# Patient Record
Sex: Female | Born: 1975 | Hispanic: Yes | Marital: Single | Smoking: Former smoker
Health system: Southern US, Community
[De-identification: ages and names within clinical notes are randomized; demographics above are authoritative.]

## PROBLEM LIST (undated history)

## (undated) DIAGNOSIS — Z8719 Personal history of other diseases of the digestive system: Secondary | ICD-10-CM

## (undated) DIAGNOSIS — Z8669 Personal history of other diseases of the nervous system and sense organs: Secondary | ICD-10-CM

## (undated) DIAGNOSIS — Z9289 Personal history of other medical treatment: Secondary | ICD-10-CM

## (undated) DIAGNOSIS — R519 Headache, unspecified: Secondary | ICD-10-CM

## (undated) DIAGNOSIS — R51 Headache: Secondary | ICD-10-CM

## (undated) DIAGNOSIS — Z8711 Personal history of peptic ulcer disease: Secondary | ICD-10-CM

## (undated) DIAGNOSIS — T7840XA Allergy, unspecified, initial encounter: Secondary | ICD-10-CM

## (undated) DIAGNOSIS — A770 Spotted fever due to Rickettsia rickettsii: Secondary | ICD-10-CM

## (undated) DIAGNOSIS — M7580 Other shoulder lesions, unspecified shoulder: Secondary | ICD-10-CM

## (undated) DIAGNOSIS — D649 Anemia, unspecified: Secondary | ICD-10-CM

## (undated) HISTORY — DX: Headache: R51

## (undated) HISTORY — DX: Headache, unspecified: R51.9

## (undated) HISTORY — DX: Personal history of other diseases of the digestive system: Z87.19

## (undated) HISTORY — DX: Anemia, unspecified: D64.9

## (undated) HISTORY — DX: Personal history of peptic ulcer disease: Z87.11

## (undated) HISTORY — DX: Spotted fever due to Rickettsia rickettsii: A77.0

## (undated) HISTORY — PX: CHOLECYSTECTOMY: SHX55

## (undated) HISTORY — DX: Personal history of other diseases of the nervous system and sense organs: Z86.69

## (undated) HISTORY — DX: Allergy, unspecified, initial encounter: T78.40XA

## (undated) HISTORY — DX: Other shoulder lesions, unspecified shoulder: M75.80

## (undated) HISTORY — PX: COSMETIC SURGERY: SHX468

## (undated) HISTORY — PX: OTHER SURGICAL HISTORY: SHX169

## (undated) HISTORY — DX: Personal history of other medical treatment: Z92.89

---

## 1998-06-12 ENCOUNTER — Encounter: Payer: Self-pay | Admitting: Emergency Medicine

## 1998-06-12 ENCOUNTER — Emergency Department (HOSPITAL_COMMUNITY): Admission: EM | Admit: 1998-06-12 | Discharge: 1998-06-12 | Payer: Self-pay | Admitting: Emergency Medicine

## 1998-08-07 DIAGNOSIS — R569 Unspecified convulsions: Secondary | ICD-10-CM

## 1998-08-07 HISTORY — DX: Unspecified convulsions: R56.9

## 2000-10-24 ENCOUNTER — Emergency Department (HOSPITAL_COMMUNITY): Admission: EM | Admit: 2000-10-24 | Discharge: 2000-10-24 | Payer: Self-pay | Admitting: Unknown Physician Specialty

## 2001-10-02 ENCOUNTER — Emergency Department (HOSPITAL_COMMUNITY): Admission: EM | Admit: 2001-10-02 | Discharge: 2001-10-02 | Payer: Self-pay

## 2002-01-17 ENCOUNTER — Encounter: Payer: Self-pay | Admitting: Internal Medicine

## 2002-01-17 ENCOUNTER — Encounter: Admission: RE | Admit: 2002-01-17 | Discharge: 2002-01-17 | Payer: Self-pay | Admitting: Internal Medicine

## 2002-01-17 ENCOUNTER — Encounter: Payer: Self-pay | Admitting: General Surgery

## 2002-01-17 ENCOUNTER — Inpatient Hospital Stay (HOSPITAL_COMMUNITY): Admission: EM | Admit: 2002-01-17 | Discharge: 2002-01-20 | Payer: Self-pay | Admitting: Endocrinology

## 2002-01-17 ENCOUNTER — Encounter (INDEPENDENT_AMBULATORY_CARE_PROVIDER_SITE_OTHER): Payer: Self-pay

## 2002-11-11 ENCOUNTER — Other Ambulatory Visit: Admission: RE | Admit: 2002-11-11 | Discharge: 2002-11-11 | Payer: Self-pay | Admitting: Obstetrics and Gynecology

## 2003-11-23 ENCOUNTER — Other Ambulatory Visit: Admission: RE | Admit: 2003-11-23 | Discharge: 2003-11-23 | Payer: Self-pay | Admitting: Obstetrics and Gynecology

## 2004-02-16 ENCOUNTER — Emergency Department (HOSPITAL_COMMUNITY): Admission: EM | Admit: 2004-02-16 | Discharge: 2004-02-17 | Payer: Self-pay | Admitting: Emergency Medicine

## 2004-03-13 ENCOUNTER — Ambulatory Visit (HOSPITAL_COMMUNITY): Admission: RE | Admit: 2004-03-13 | Discharge: 2004-03-13 | Payer: Self-pay | Admitting: Sports Medicine

## 2004-09-28 ENCOUNTER — Other Ambulatory Visit: Admission: RE | Admit: 2004-09-28 | Discharge: 2004-09-28 | Payer: Self-pay | Admitting: Obstetrics and Gynecology

## 2004-11-15 ENCOUNTER — Ambulatory Visit: Payer: Self-pay | Admitting: Internal Medicine

## 2006-03-02 IMAGING — CR DG KNEE COMPLETE 4+V*L*
4 series · 4 of 4 positions shown · non-contrast
Comparison: none

CLINICAL DATA: Left knee injury. 
 FOUR VIEW, LEFT KNEE ? 02/16/2004
 No prior studies.

[view not recorded (1 of 4)]
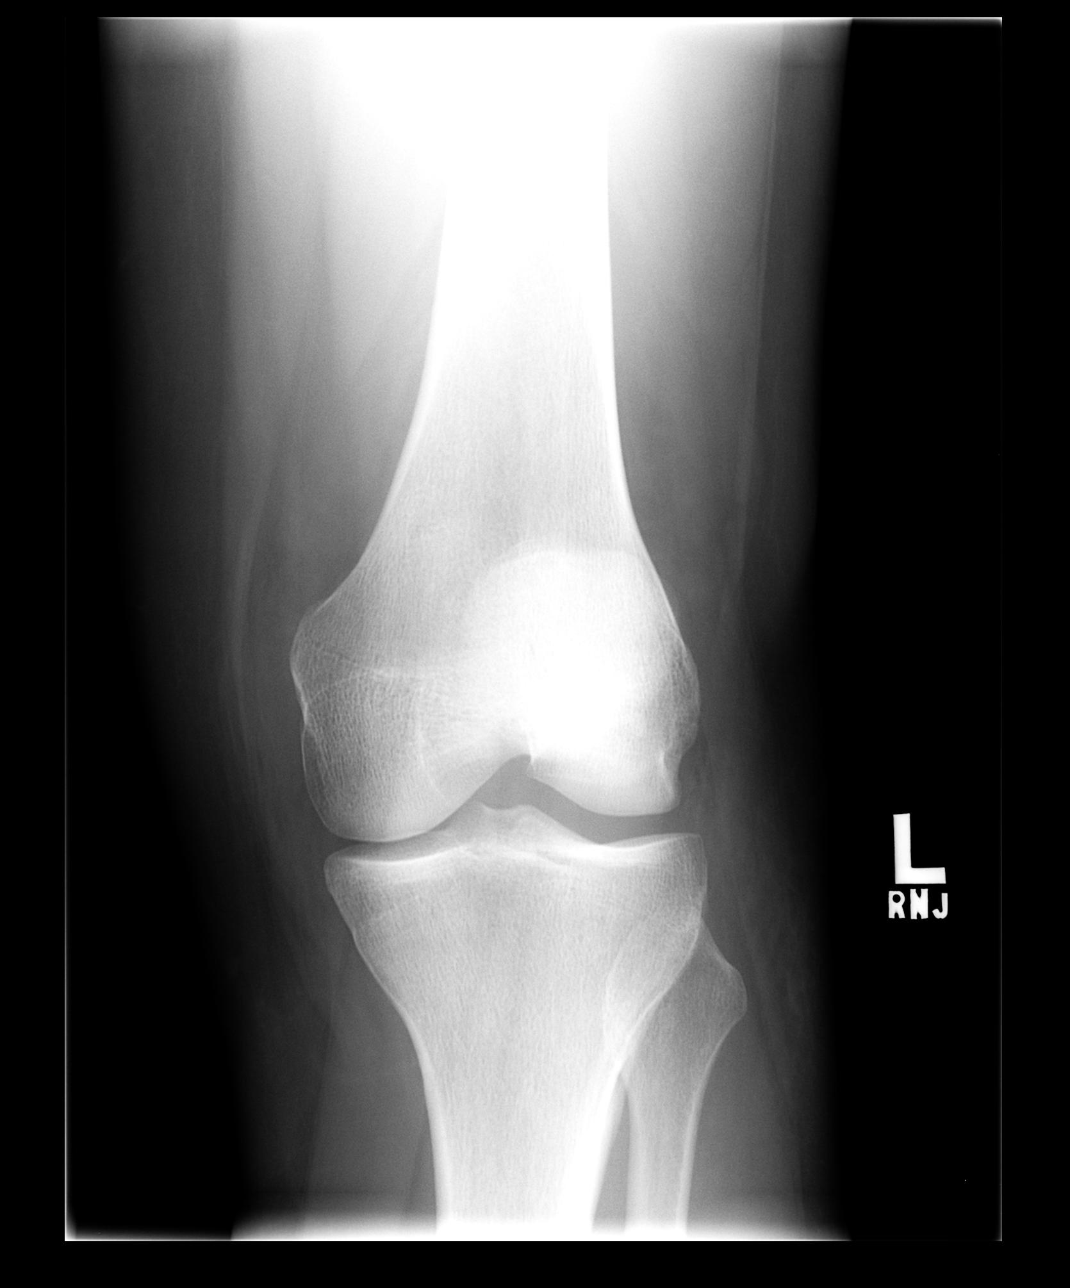

[view not recorded (2 of 4)]
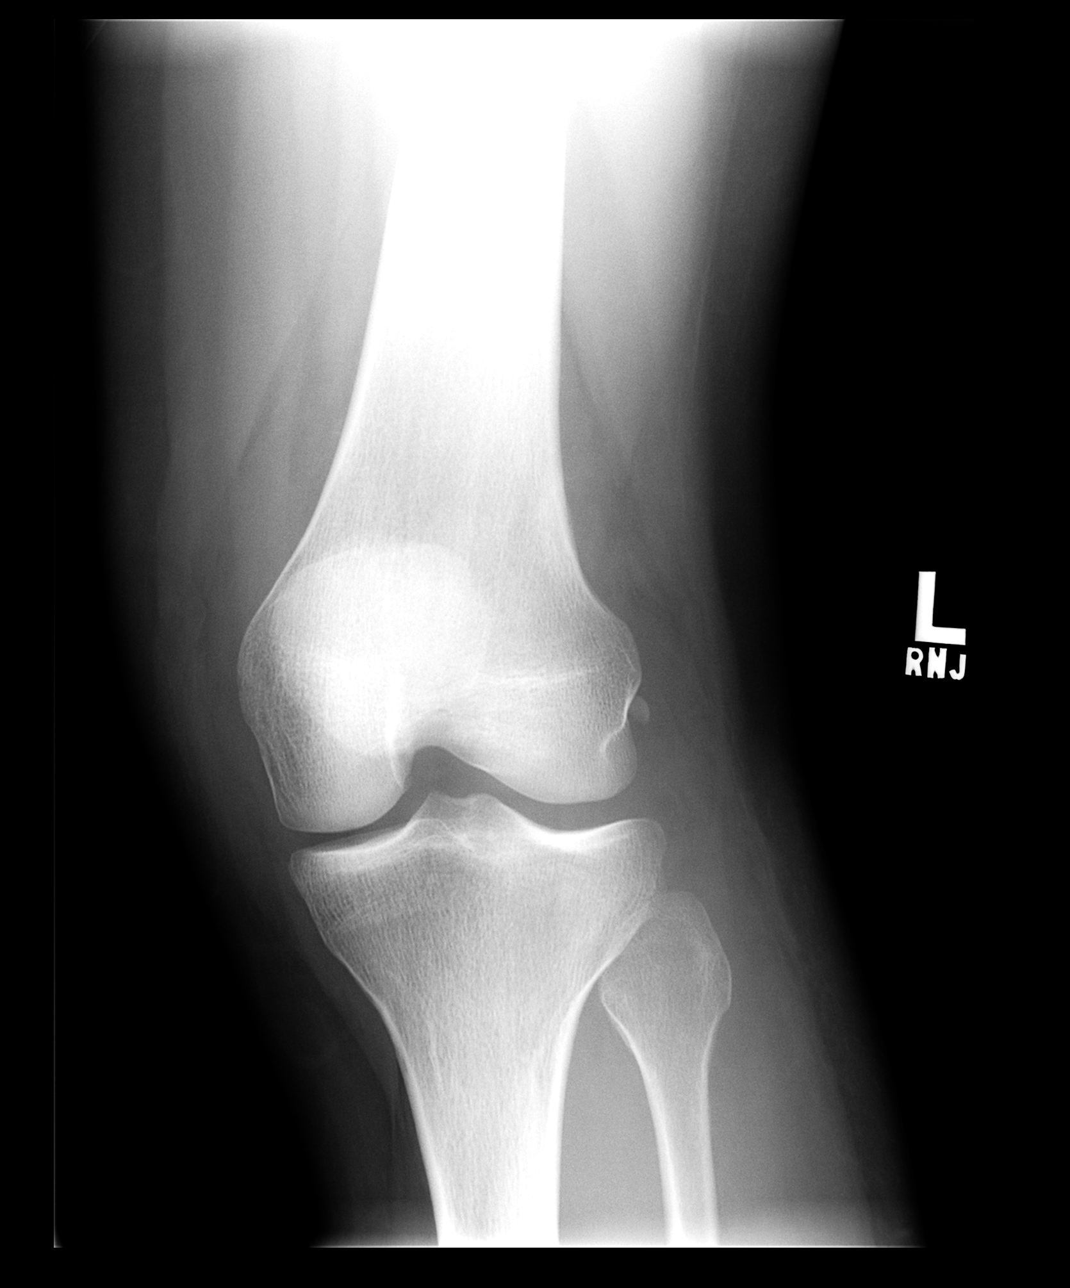

[view not recorded (3 of 4)]
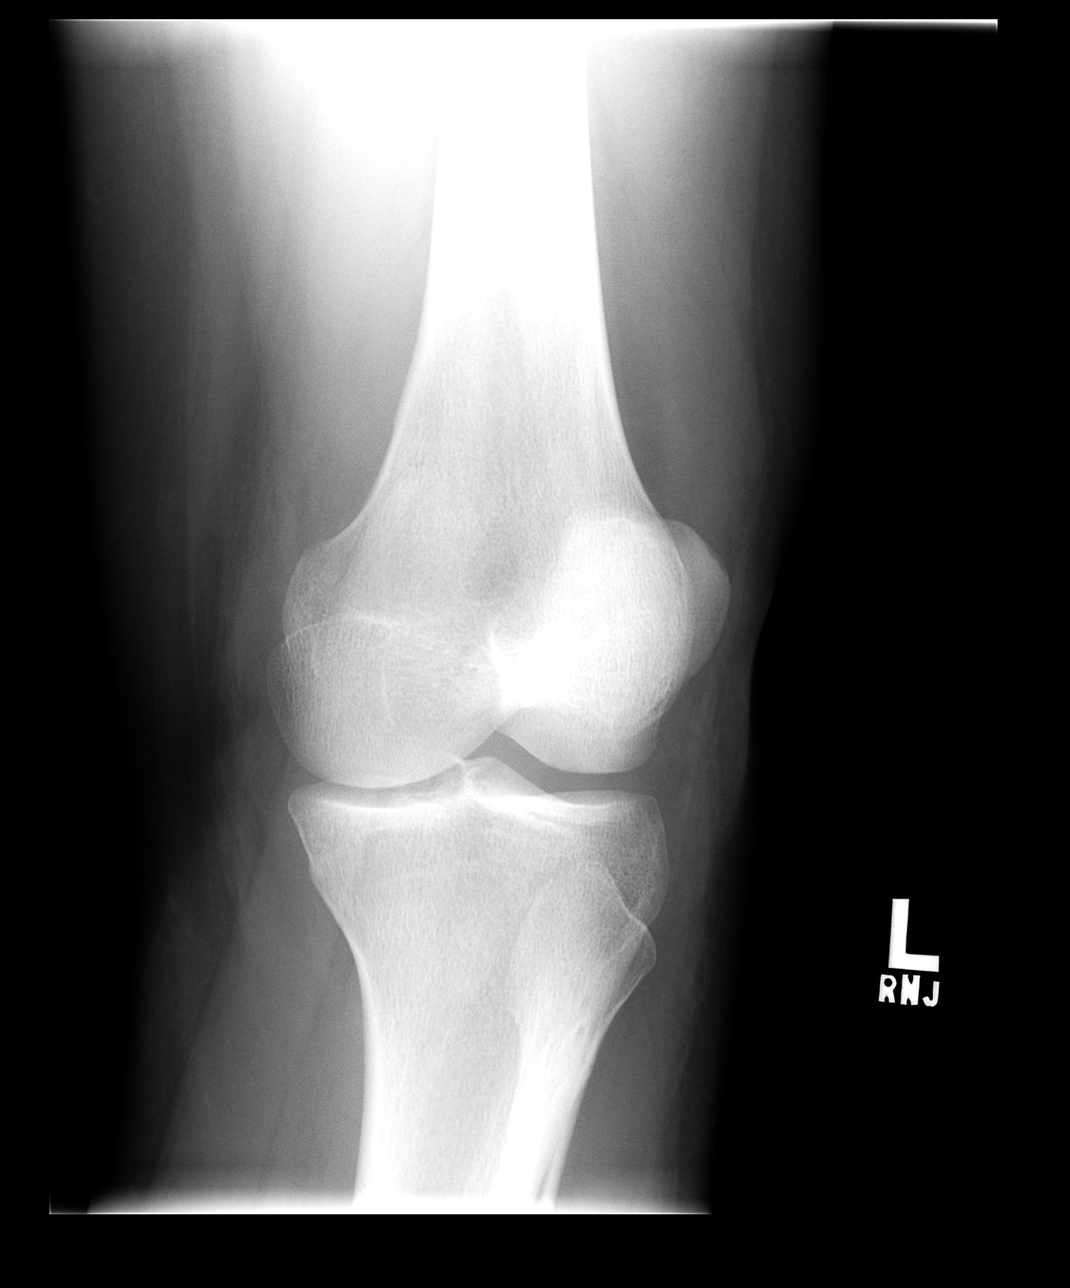

[view not recorded (4 of 4)]
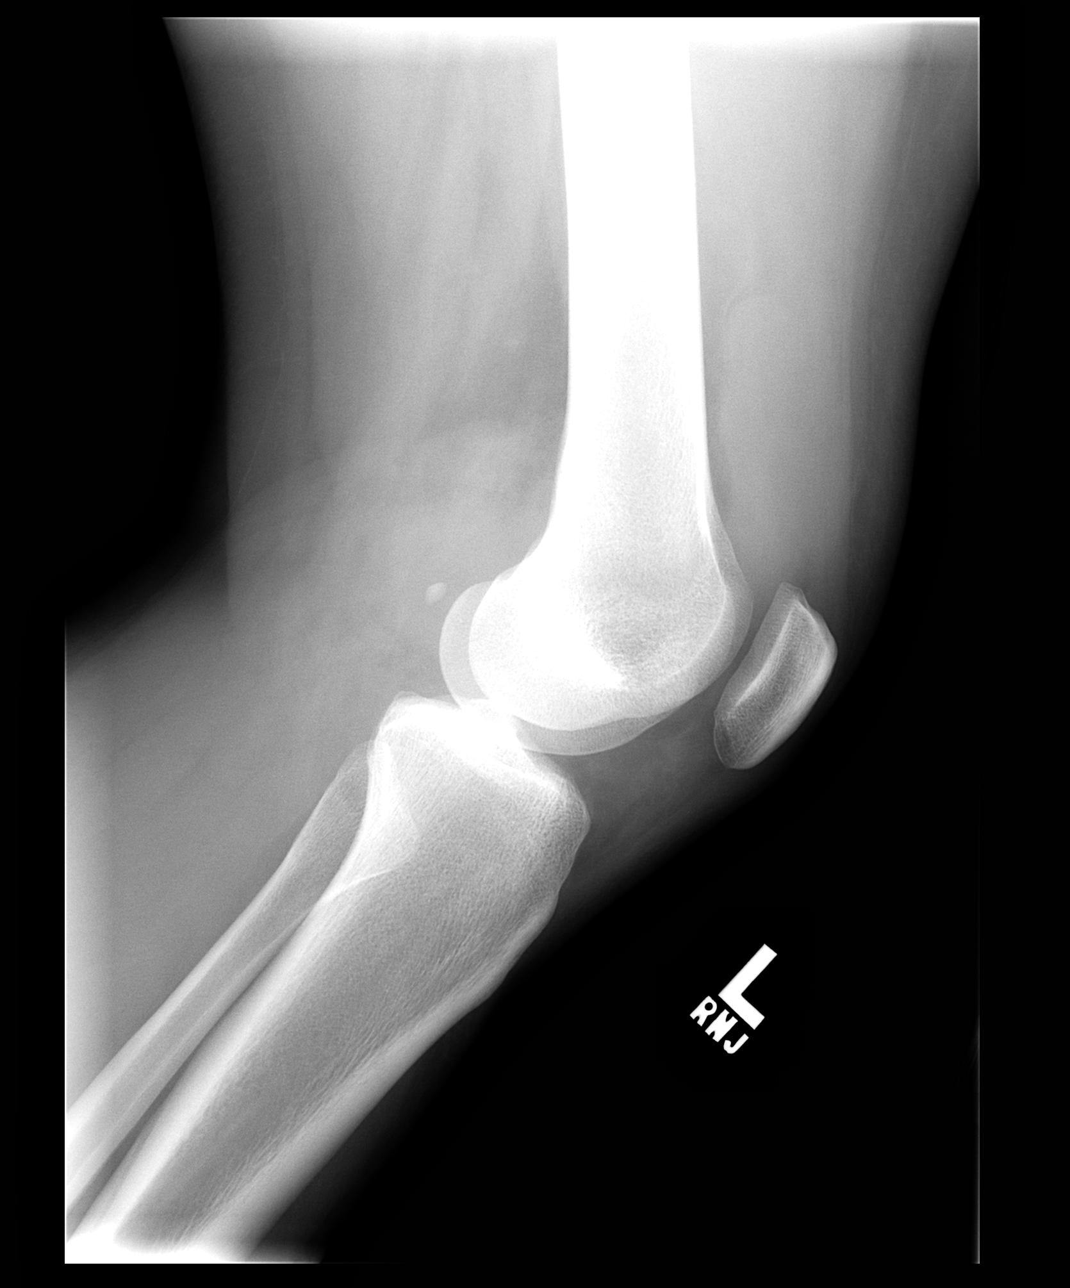

[4 of 4 positions shown; findings below may reference images not displayed]

FINDINGS: There is a prominent knee effusion.  No visible fracture. No dislocation is evident.  Note is made of a fabella. 
 IMPRESSION
 Knee effusion.  If there is a high degree of suspicion of internal derangement, then MRI may be helpful in further characterization.

## 2010-12-23 NOTE — H&P (Signed)
[facility]  Patient:    [name], [name] Visit Number: [id number] MRN: [id number]          Service Type: MED Location: [phone] Attending Physician:  Caleen Essex Dictated by:   Justine Null, M.D. [facility] Admit Date:  01/17/2002 Discharge Date: 01/20/2002                           History and Physical  DATE OF BIRTH:  [birth year].  REASON FOR ADMISSION:  Abdominal pain.  HISTORY OF PRESENT ILLNESS:  The patient is a 35 year old woman with four days of episodic severe right upper quadrant pain radiating across the right side of the abdomen.  There is associated shaking chills.  No particular contacts. There was an associated presyncopal episode three days ago but none since. Last menstrual period ended four days ago.  PAST MEDICAL HISTORY: 1. Epilepsy. 2. Migraine. 3. Insomnia. 4. Depression.  MEDICATIONS: 1. Zonegran 100 mg a day. 2. Ambien 10 mg daily. 3. Oral contraceptive.  REVIEW SOCIAL HISTORY:  Alcohol:  Occasional only.  The patient is a smoker. She is single.  She has no children.  She works as a Counsellor.  FAMILY HISTORY:  No one else at home is ill.  REVIEW OF SYSTEMS:  Denies the following:  Fever, headache, sore throat, earache, syncope, shortness of breath, diarrhea, heartburn, nausea, vomiting, bright red blood per rectum, hematuria, and dysuria.  PHYSICAL EXAMINATION:  VITAL SIGNS:  Blood pressure 120/80, heart rate is 80, temperature is 98.3, the weight is 143.  GENERAL:  No distress.  SKIN:  Not diaphoretic.  NODES:  There is no palpable lymphadenopathy.  HEENT:  Head is atraumatic.  Sclerae not icteric.  The pharynx is clear.  NECK:  Supple, no goiter.  CHEST:  Clear to auscultation.  It is nontender.  No respiratory distress.  CARDIAC:  No JVD, no edema.  Regular rate and rhythm, no murmur.  Pedal pulses are intact.  ABDOMEN:  Soft, nontender, no hepatosplenomegaly, no  mass.  Of particular note is that there is no tenderness to the right upper quadrant, even with deep palpation.  GYNECOLOGIC, RECTAL:  Not done at this time.  EXTREMITIES:  No obvious deformity of the joints.  NEUROLOGIC:  Alert, well-oriented.  Gait is observed to be normal, and cranial nerves are grossly intact.  LABORATORY DATA:  Urinalysis is normal.  CBC done three days ago is positive for elevated WBC at 14,700.  Ultrasound of the gallbladder today shows a small, contracted gallbladder with stones.  IMPRESSION: 1. Abdominal pain, uncertain etiology.  There is a high risk of serious    underlying infection given the seriousness of her clinical syndrome and her    elevated WBC and abnormal ultrasound. 2. Other chronic medical problems as noted above.  PLAN: 1. Admit to [facility] 2. Blood cultures. 3. Antibiotics.  Alternative antibiotics are chosen because she is allergic to    PENICILLIN. 4. Stat general surgery consultation.  I have called Ollen Gross. Vernell Morgans, M.D. Dictated by:   Justine Null, M.D. [facility] Attending Physician:  Caleen Essex DD:  01/17/02 TD:  01/20/02 Job: [id number]

## 2010-12-23 NOTE — Discharge Summary (Signed)
[facility]  Patient:    [name] [name], [name] [name] Visit Number: [id number] MRN: [id number]          Service Type: MED Location: [phone] Attending Physician:  Caleen Essex Dictated by:   Ollen Gross. Vernell Morgans, M.D. Admit Date:  01/17/2002 Discharge Date: 01/20/2002                             Discharge Summary  HISTORY OF PRESENT ILLNESS:  The patient is a 35 year old, African-American female who was admitted by the medical service with some right-sided abdominal pain as well as some subjective chills and shakes.  She has a history of a seizure disorder and was admitted to work this up.  She was also found to have gallstones.  HOSPITAL COURSE:  She was not symptomatic at the time of discharge, but since she was already admitted to the hospital and was in the process of being worked up, she elected to have her surgery during this hospitalization.  She was taken to the operating room on June 15, and underwent a laparoscopic cholecystectomy that was unremarkable.  By June 16, she was tolerating her diet and ready for discharge home.  DISCHARGE MEDICATIONS: 1. Resume home medications. 2. Prescription for Vicodin for pain.  DIET:  As tolerated.  ACTIVITY:  No heavy lifting.  CONDITION ON DISCHARGE:  Stable.  DISCHARGE DIAGNOSIS:  Cholelithiasis.  FOLLOWUP:  Follow up with the surgeons in two weeks.  DISPOSITION:  Discharged to home. Dictated by:   Ollen Gross. Vernell Morgans, M.D. Attending Physician:  Caleen Essex DD:  02/24/02 TD:  03/02/02 Job: [phone]

## 2010-12-23 NOTE — Op Note (Signed)
[facility]  Patient:    [name] [name], [name] [name] Visit Number: [id number] MRN: [id number]          Service Type: MED Location: [phone] Attending Physician:  Justine Null Dictated by:   Ollen Gross. Vernell Morgans, M.D. Proc. Date: 01/19/02 Admit Date:  01/17/2002                             Operative Report  PREOPERATIVE DIAGNOSIS:  Cholelithiasis.  POSTOPERATIVE DIAGNOSIS:  Cholelithiasis.  PROCEDURE:  Laparoscopic cholecystectomy.  SURGEON:  Ollen Gross. Vernell Morgans, M.D.  ASSISTANT:  Dr. Jamey Ripa.  ANESTHESIA:  General endotracheal.  DESCRIPTION OF PROCEDURE:  After informed consent was obtained, the patient was brought to the operating room and placed in the supine position on the operating table.  After adequate induction of general anesthesia, the patients abdomen was prepped with Betadine and draped in the usual sterile manner.  The area below the umbilicus was infiltrated with 0.25% Marcaine, and a small vertically-oriented incision was made with the 15 blade knife.  This incision was carried down through the subcutaneous tissue with blunt dissection using a Kelly clamp and [employer] retractors until the linea alba was identified.  The linea alba was incised with the 15 blade knife, and each side was grasped with Kocher clamps and elevated anteriorly.  The preperitoneal space was probed bluntly with a hemostat until the peritoneum was opened and access was gained to the abdominal cavity.  A 0 Vicryl pursestring stitch was placed in the fascia surrounding this opening.  A Hasson cannula was placed through this opening and anchored in place with the previously-placed Vicryl pursestring stitch.  The abdomen was insufflated with carbon dioxide without difficulty.  The patient was placed in the head-up position.  The laparoscope was placed through the Hasson cannula, and the edge of the liver and dome of the gallbladder were readily identifiable.  A  small transverse upper midline incision was made after infiltrating this area with 0.25% Marcaine, and a 10 mm port was then placed bluntly through this incision into the abdominal cavity under direct vision.  Sites were then chosen laterally on the right side of the abdomen for placement of 5 mm ports, and each of these areas were infiltrated with 0.25% Marcaine.  Small stab incisions were made with the 15 blade knife, and 5 mm ports were placed bluntly through these incisions into the abdominal cavity under direct vision. A blunt grasper was placed through the lateral-most 5 mm port and used to grasp the dome of the gallbladder and elevate it anteriorly and superiorly. Another blunt grasper was placed through the other 5 mm port and used to retract on the body and neck of the gallbladder.  A dissector was placed through the upper midline port, and the peritoneal reflection at the gallbladder neck was opened using the electrocautery.  Blunt dissection was then carried out in this area until the gallbladder neck cystic duct junction was readily identified, and a good window was created.  Once this was done, three clips were placed proximally, one distally on the cystic duct, and the duct was divided between the two.  Care was taken to keep the common duct medial through all of the dissection.  Posterior to this, the cystic artery was identified and again dissected in a circumferential manner until there was a good window.  Three clips were placed proximally, one distally on the artery, and the artery  was divided between the two.  Next, a laparoscopic hook cautery device was used to separate the gallbladder from the liver bed. Prior to completely detaching the gallbladder from the liver bed, the liver bed was inspected, and several small bleeding points were coagulated with electrocautery until the area was  hemostatic.  The gallbladder was then detached the rest of the way from the liver  bed without difficulty.  The laparoscope was then moved to the upper midline port, and a gallbladder grasper was placed through the Hasson cannula and grasped the neck of the gallbladder.  The gallbladder was removed with the Hasson cannula through the infraumbilical port without difficulty.  The fascia was then closed with the previously-placed Vicryl pursestring stitch.  The abdomen was then irrigated with saline until the effluent was clear.  The infraumbilical port was visualized from inside, and there appeared to be a small ooze from the fat which was coagulated with the electrocautery.  The rest of the ports were then removed under direct vision and were all hemostatic.  The skin incisions were all closed with interrupted 4-0 Monocryl subcuticular stitches.  Benzoin, Steri-Strips, and sterile dressings were applied.  The patient tolerated the procedure well.  At the end of case, all sponge, needle, and instrument counts were correct.  The patient was awakened and taken to the recovery room in stable condition. Dictated by:   Ollen Gross. Vernell Morgans, M.D. Attending Physician:  Justine Null DD:  01/19/02 TD:  01/20/02 Job: [phone]

## 2010-12-29 ENCOUNTER — Ambulatory Visit: Payer: Self-pay | Admitting: Internal Medicine

## 2010-12-29 DIAGNOSIS — Z0289 Encounter for other administrative examinations: Secondary | ICD-10-CM

## 2013-05-28 ENCOUNTER — Ambulatory Visit: Payer: Self-pay | Admitting: Internal Medicine

## 2013-05-30 ENCOUNTER — Other Ambulatory Visit (INDEPENDENT_AMBULATORY_CARE_PROVIDER_SITE_OTHER): Payer: 59

## 2013-05-30 ENCOUNTER — Ambulatory Visit (INDEPENDENT_AMBULATORY_CARE_PROVIDER_SITE_OTHER): Payer: 59 | Admitting: Internal Medicine

## 2013-05-30 ENCOUNTER — Encounter: Payer: Self-pay | Admitting: Internal Medicine

## 2013-05-30 VITALS — BP 130/92 | HR 89 | Temp 98.4°F | Ht 65.5 in | Wt 194.8 lb

## 2013-05-30 DIAGNOSIS — Z716 Tobacco abuse counseling: Secondary | ICD-10-CM

## 2013-05-30 DIAGNOSIS — Z130 Encounter for screening for diseases of the blood and blood-forming organs and certain disorders involving the immune mechanism: Secondary | ICD-10-CM

## 2013-05-30 DIAGNOSIS — Z13220 Encounter for screening for lipoid disorders: Secondary | ICD-10-CM

## 2013-05-30 DIAGNOSIS — Z0000 Encounter for general adult medical examination without abnormal findings: Secondary | ICD-10-CM

## 2013-05-30 DIAGNOSIS — Z1329 Encounter for screening for other suspected endocrine disorder: Secondary | ICD-10-CM

## 2013-05-30 DIAGNOSIS — Z23 Encounter for immunization: Secondary | ICD-10-CM

## 2013-05-30 DIAGNOSIS — Z131 Encounter for screening for diabetes mellitus: Secondary | ICD-10-CM

## 2013-05-30 LAB — CBC
HCT: 42.1 % (ref 36.0–46.0)
Hemoglobin: 13.8 g/dL (ref 12.0–15.0)
MCHC: 32.9 g/dL (ref 30.0–36.0)
MCV: 82.2 fl (ref 78.0–100.0)
Platelets: 261 10*3/uL (ref 150.0–400.0)
RDW: 13.9 % (ref 11.5–14.6)

## 2013-05-30 LAB — LIPID PANEL
HDL: 48.7 mg/dL (ref >39.00)
Total CHOL/HDL Ratio: 3
Triglycerides: 59 mg/dL (ref 0.0–149.0)
VLDL: 11.8 mg/dL (ref 0.0–40.0)

## 2013-05-30 LAB — COMPREHENSIVE METABOLIC PANEL
ALT: 20 U/L (ref 0–35)
AST: 17 U/L (ref 0–37)
BUN: 7 mg/dL (ref 6–23)
CO2: 29 mEq/L (ref 19–32)
Calcium: 9.4 mg/dL (ref 8.4–10.5)
Chloride: 105 mEq/L (ref 96–112)
Creatinine, Ser: 0.7 mg/dL (ref 0.4–1.2)
GFR: 96.43 mL/min (ref >60.00)
Total Bilirubin: 0.6 mg/dL (ref 0.3–1.2)

## 2013-05-30 LAB — TSH: TSH: 0.75 u[IU]/mL (ref 0.35–5.50)

## 2013-05-30 MED ORDER — VARENICLINE TARTRATE 0.5 MG X 11 & 1 MG X 42 PO MISC: ORAL | Status: DC

## 2013-05-30 NOTE — Addendum Note (Signed)
Addended by: Deatra James on: 05/30/2013 02:30 PM   Modules accepted: Orders

## 2013-05-30 NOTE — Progress Notes (Signed)
HPI  Pt presents to the clinic today to establish care. She is not transferring care from a PCP. She does have a gyn when she needs it. She does have some concerns today about smoking cessation. She would like to quit on 06/13/2013. She would like to [place] Chantix.  Flu: 05/2013 Tetanus: unsure of date-request one today LMP: 03/2013-hx of irreularity Pap Smear: 05/2013 Dentist: as needed   Past Medical History  Diagnosis Date  . Allergy   . Hx of migraines   . Headache   . History of stomach ulcers   . History of blood transfusion     Current Outpatient Prescriptions  Medication Sig Dispense Refill  . Prenatal Multivit-Min-Fe-FA (PRENATAL VITAMINS PO) Take by mouth daily.       No current facility-administered medications for this visit.    Allergies  Allergen Reactions  . Penicillins   . Zyrtec [Cetirizine]     History reviewed. No pertinent family history.  History   Social History  . Marital Status: Married    Spouse Name: N/A    Number of Children: N/A  . Years of Education: N/A   Occupational History  . Not on file.   Social History Main Topics  . Smoking status: Not on file  . Smokeless tobacco: Not on file  . Alcohol Use: Not on file  . Drug Use: Not on file  . Sexual Activity: Not on file   Other Topics Concern  . Not on file   Social History Narrative  . No narrative on file    ROS:  Constitutional: Denies fever, malaise, fatigue, headache or abrupt weight changes.  HEENT: Denies eye pain, eye redness, ear pain, ringing in the ears, wax buildup, runny nose, nasal congestion, bloody nose, or sore throat. Respiratory: Denies difficulty breathing, shortness of breath, cough or sputum production.   Cardiovascular: Denies chest pain, chest tightness, palpitations or swelling in the hands or feet.  Gastrointestinal: Denies abdominal pain, bloating, constipation, diarrhea or blood in the stool.  GU: Pt reports vaginal irritation. Denies frequency,  urgency, pain with urination, blood in urine, odor or discharge. Musculoskeletal: Denies decrease in range of motion, difficulty with gait, muscle pain or joint pain and swelling.  Skin: Denies redness, rashes, lesions or ulcercations.  Neurological: Denies dizziness, difficulty with memory, difficulty with speech or problems with balance and coordination.   No other specific complaints in a complete review of systems (except as listed in HPI above).  PE:  BP 130/92  Pulse 89  Temp(Src) 98.4 F (36.9 C) (Oral)  Ht 5' 5.5" (1.664 m)  Wt 194 lb 12.8 oz (88.361 kg)  BMI 31.91 kg/m2  SpO2 98% Wt Readings from Last 3 Encounters:  05/30/13 194 lb 12.8 oz (88.361 kg)    General: Appears her stated age, obese but well developed, well nourished in NAD. HEENT: Head: normal shape and size; Eyes: sclera white, no icterus, conjunctiva pink, PERRLA and EOMs intact; Ears: Tm's gray and intact, normal light reflex; Nose: mucosa pink and moist, septum midline; Throat/Mouth: Teeth present, mucosa pink and moist, no lesions or ulcerations noted.  Neck: Normal range of motion. Neck supple, trachea midline. No massses, lumps or thyromegaly present.  Cardiovascular: Normal rate and rhythm. S1,S2 noted.  No murmur, rubs or gallops noted. No JVD or BLE edema. No carotid bruits noted. Pulmonary/Chest: Normal effort and positive vesicular breath sounds. No respiratory distress. No wheezes, rales or ronchi noted.  Abdomen: Soft and nontender. Normal bowel sounds, no bruits  noted. No distention or masses noted. Liver, spleen and kidneys non palpable. Musculoskeletal: Normal range of motion. No signs of joint swelling. No difficulty with gait.  Neurological: Alert and oriented. Cranial nerves II-XII intact. Coordination normal. +DTRs bilaterally. Psychiatric: Mood and affect normal. Behavior is normal. Judgment and thought content normal.      Assessment and Plan:  Preventative Health Maintenance:  Tdap  given today Encouraged pt to visit dentist yearly Will obtain screening labs Will start Chantix for smoking cessation  RTC in 1 year or sooner if needed

## 2013-05-30 NOTE — Progress Notes (Signed)
HPI: Pt presents to the office today to establish care. Pt does have a concern today about some vaginal irritation. Pt recently went to GYN as was diagnosed with BV and Trichomonas as well as a yeast infection and was treated with antibiotics. Pt denies vaginal discharge, odor, or itching. Describes symptoms more as irritation with wiping. Pt denies any other complaints today. Pt endorses some exercise and obtaining a recent gym membership, does endorse she understands she needs to lose weight. Pt endorses an unhealthy diet, stating she eats on the go frequently due to her job and does not make healthy food choices.   LMP: 04/2013; pt endorses being irregular; denies being sexually active; pregnancy test negative at GYN 2 weeks prior PAP smear: 05/2013 Tetanus: Unknown; received today Flu: 05/2013 Dentist: >10 yrs; will make appt soon  Past Medical History  Diagnosis Date  . Allergy   . Hx of migraines   . Headache   . History of stomach ulcers   . History of blood transfusion   . [facility] spotted fever     Current Outpatient Prescriptions  Medication Sig Dispense Refill  . Prenatal Multivit-Min-Fe-FA (PRENATAL VITAMINS PO) Take by mouth daily.       No current facility-administered medications for this visit.    Allergies  Allergen Reactions  . Penicillins   . Zyrtec [Cetirizine]     Family History  Problem Relation Age of Onset  . Alcohol abuse Father   . Cancer Father     colon/pancreatic  . Diabetes Father   . Heart disease Father   . Hyperlipidemia Father   . Hypertension Father   . Kidney disease Father   . Stroke Father   . Cancer Sister     ovarian    History   Social History  . Marital Status: Married    Spouse Name: N/A    Number of Children: N/A  . Years of Education: N/A   Occupational History  . Not on file.   Social History Main Topics  . Smoking status: Smoker, Current Status Unknown -- 0.50 packs/day    Types: Cigarettes  . Smokeless  tobacco: Not on file  . Alcohol Use: Yes     Comment: occasionally/socially  . Drug Use: No  . Sexual Activity: Yes    Birth Control/ Protection: Condom   Other Topics Concern  . Not on file   Social History Narrative  . No narrative on file    ROS:  Constitutional: Denies fever, malaise, fatigue, headache or abrupt weight changes.  HEENT: Denies eye pain, eye redness, ear pain, ringing in the ears, wax buildup, runny nose, nasal congestion, bloody nose, or sore throat. Respiratory: Denies difficulty breathing, shortness of breath, cough or sputum production.   Cardiovascular: Denies chest pain, chest tightness, palpitations or swelling in the hands or feet.  Gastrointestinal: Denies abdominal pain, bloating, constipation, diarrhea or blood in the stool.  GYN: Endorses vaginal irritation. Denies pain, discharge, odor, or itching. GU: Denies frequency, urgency, pain with urination, blood in urine, odor or discharge. Musculoskeletal: Denies decrease in range of motion, difficulty with gait, muscle pain or joint pain and swelling.  Skin: Denies redness, rashes, lesions or ulcercations.  Neurological: Denies dizziness, difficulty with memory, difficulty with speech or problems with balance and coordination.   No other specific complaints in a complete review of systems (except as listed in HPI above).  PE:  BP 130/92  Pulse 89  Temp(Src) 98.4 F (36.9 C) (Oral)  Ht  5' 5.5" (1.664 m)  Wt 194 lb 12.8 oz (88.361 kg)  BMI 31.91 kg/m2  SpO2 98% Wt Readings from Last 3 Encounters:  05/30/13 194 lb 12.8 oz (88.361 kg)    General: Appears their stated age, well developed, well nourished in NAD. HEENT: Head: normal shape and size; Eyes: sclera white, no icterus, conjunctiva pink, PERRLA and EOMs intact; Ears: Tm's gray and intact, normal light reflex; Nose: mucosa pink and moist, septum midline; Throat/Mouth: Teeth present, mucosa pink and moist, no lesions or ulcerations noted.   Neck: Normal range of motion. Neck supple, trachea midline. No massses, lumps or thyromegaly present.  Cardiovascular: Normal rate and rhythm. S1,S2 noted.  No murmur, rubs or gallops noted. No JVD or BLE edema. No carotid bruits noted. Pulmonary/Chest: Normal effort and positive vesicular breath sounds. No respiratory distress. No wheezes, rales or ronchi noted.  Abdomen: Soft and nontender. Normal bowel sounds, no bruits noted. No distention or masses noted. Liver, spleen and kidneys non palpable. Musculoskeletal: Normal range of motion. No signs of joint swelling. No difficulty with gait.  Neurological: Alert and oriented. Cranial nerves II-XII intact. Coordination normal. +DTRs bilaterally. Psychiatric: Mood and affect normal. Behavior is normal. Judgment and thought content normal.    Assessment and Plan: Health Maintenance Check CBC, CMET, Lipid Panel, and A1C Educated on diet and exercise; limit fast food and increase daily fruits and vegetables  Smoking Cessation education provided Chantix starter packet prescription provided Educated on black box warning and possible side effects Stated quit date 06/13/13  Vaginal discomfort Recommended OTC Monistat for symptoms discomfort  Follow up in one year or sooner for symptom management  Thuan Tippett, Jacques Earthly, Student-NP

## 2013-05-30 NOTE — Patient Instructions (Addendum)
Health Maintenance, Females A healthy lifestyle and preventative care can promote health and wellness.  Maintain regular health, dental, and eye exams.  Eat a healthy diet. Foods like vegetables, fruits, whole grains, low-fat dairy products, and lean protein foods contain the nutrients you need without too many calories. Decrease your intake of foods high in solid fats, added sugars, and salt. Get information about a proper diet from your caregiver, if necessary.  Regular physical exercise is one of the most important things you can do for your health. Most adults should get at least 150 minutes of moderate-intensity exercise (any activity that increases your heart rate and causes you to sweat) each week. In addition, most adults need muscle-strengthening exercises on 2 or more days a week.   Maintain a healthy weight. The body mass index (BMI) is a screening tool to identify possible weight problems. It provides an estimate of body fat based on height and weight. Your caregiver can help determine your BMI, and can help you achieve or maintain a healthy weight. For adults 20 years and older:  A BMI below 18.5 is considered underweight.  A BMI of 18.5 to 24.9 is normal.  A BMI of 25 to 29.9 is considered overweight.  A BMI of 30 and above is considered obese.  Maintain normal blood lipids and cholesterol by exercising and minimizing your intake of saturated fat. Eat a balanced diet with plenty of fruits and vegetables. Blood tests for lipids and cholesterol should begin at age 20 and be repeated every 5 years. If your lipid or cholesterol levels are high, you are over 50, or you are a high risk for heart disease, you may need your cholesterol levels checked more frequently.Ongoing high lipid and cholesterol levels should be treated with medicines if diet and exercise are not effective.  If you smoke, find out from your caregiver how to quit. If you do not use tobacco, do not start.  If you  are pregnant, do not drink alcohol. If you are breastfeeding, be very cautious about drinking alcohol. If you are not pregnant and choose to drink alcohol, do not exceed 1 drink per day. One drink is considered to be 12 ounces (355 mL) of beer, 5 ounces (148 mL) of wine, or 1.5 ounces (44 mL) of liquor.  Avoid use of street drugs. Do not share needles with anyone. Ask for help if you need support or instructions about stopping the use of drugs.  High blood pressure causes heart disease and increases the risk of stroke. Blood pressure should be checked at least every 1 to 2 years. Ongoing high blood pressure should be treated with medicines, if weight loss and exercise are not effective.  If you are 55 to 38 years old, ask your caregiver if you should take aspirin to prevent strokes.  Diabetes screening involves taking a blood sample to check your fasting blood sugar level. This should be done once every 3 years, after age 45, if you are within normal weight and without risk factors for diabetes. Testing should be considered at a younger age or be carried out more frequently if you are overweight and have at least 1 risk factor for diabetes.  Breast cancer screening is essential preventative care for women. You should practice "breast self-awareness." This means understanding the normal appearance and feel of your breasts and may include breast self-examination. Any changes detected, no matter how small, should be reported to a caregiver. Women in their 20s and 30s should have   a clinical breast exam (CBE) by a caregiver as part of a regular health exam every 1 to 3 years. After age 40, women should have a CBE every year. Starting at age 40, women should consider having a mammogram (breast X-ray) every year. Women who have a family history of breast cancer should talk to their caregiver about genetic screening. Women at a high risk of breast cancer should talk to their caregiver about having an MRI and a  mammogram every year.  The Pap test is a screening test for cervical cancer. Women should have a Pap test starting at age 21. Between ages 21 and 29, Pap tests should be repeated every 2 years. Beginning at age 30, you should have a Pap test every 3 years as long as the past 3 Pap tests have been normal. If you had a hysterectomy for a problem that was not cancer or a condition that could lead to cancer, then you no longer need Pap tests. If you are between ages 65 and 70, and you have had normal Pap tests going back 10 years, you no longer need Pap tests. If you have had past treatment for cervical cancer or a condition that could lead to cancer, you need Pap tests and screening for cancer for at least 20 years after your treatment. If Pap tests have been discontinued, risk factors (such as a new sexual partner) need to be reassessed to determine if screening should be resumed. Some women have medical problems that increase the chance of getting cervical cancer. In these cases, your caregiver may recommend more frequent screening and Pap tests.  The human papillomavirus (HPV) test is an additional test that may be used for cervical cancer screening. The HPV test looks for the virus that can cause the cell changes on the cervix. The cells collected during the Pap test can be tested for HPV. The HPV test could be used to screen women aged 30 years and older, and should be used in women of any age who have unclear Pap test results. After the age of 30, women should have HPV testing at the same frequency as a Pap test.  Colorectal cancer can be detected and often prevented. Most routine colorectal cancer screening begins at the age of 50 and continues through age 75. However, your caregiver may recommend screening at an earlier age if you have risk factors for colon cancer. On a yearly basis, your caregiver may provide home test kits to check for hidden blood in the stool. Use of a small camera at the end of a  tube, to directly examine the colon (sigmoidoscopy or colonoscopy), can detect the earliest forms of colorectal cancer. Talk to your caregiver about this at age 50, when routine screening begins. Direct examination of the colon should be repeated every 5 to 10 years through age 75, unless early forms of pre-cancerous polyps or small growths are found.  Hepatitis C blood testing is recommended for all people born from 1945 through 1965 and any individual with known risks for hepatitis C.  Practice safe sex. Use condoms and avoid high-risk sexual practices to reduce the spread of sexually transmitted infections (STIs). Sexually active women aged 25 and younger should be checked for Chlamydia, which is a common sexually transmitted infection. Older women with new or multiple partners should also be tested for Chlamydia. Testing for other STIs is recommended if you are sexually active and at increased risk.  Osteoporosis is a disease in which the   bones lose minerals and strength with aging. This can result in serious bone fractures. The risk of osteoporosis can be identified using a bone density scan. Women ages 5 and over and women at risk for fractures or osteoporosis should discuss screening with their caregivers. Ask your caregiver whether you should be taking a calcium supplement or vitamin D to reduce the rate of osteoporosis.  Menopause can be associated with physical symptoms and risks. Hormone replacement therapy is available to decrease symptoms and risks. You should talk to your caregiver about whether hormone replacement therapy is right for you.  Use sunscreen with a sun protection factor (SPF) of 30 or greater. Apply sunscreen liberally and repeatedly throughout the day. You should seek shade when your shadow is shorter than you. Protect yourself by wearing long sleeves, pants, a wide-brimmed hat, and sunglasses year round, whenever you are outdoors.  Notify your caregiver of new moles or  changes in moles, especially if there is a change in shape or color. Also notify your caregiver if a mole is larger than the size of a pencil eraser.  Stay current with your immunizations. Document Released: 02/06/2011 Document Revised: 10/16/2011 Document Reviewed: 02/06/2011 [facility] Patient Information 2014 [place], [place]. Varenicline oral tablets What is this medicine? VARENICLINE (var EN i kleen) is used to help people quit smoking. It can reduce the symptoms caused by stopping smoking. It is used with a patient support program recommended by your physician. This medicine may be used for other purposes; ask your health care provider or pharmacist if you have questions. What should I tell my health care provider before I take this medicine? They need to know if you have any of these conditions: -bipolar disorder, depression, schizophrenia or other mental illness -heart disease -kidney disease -peripheral vascular disease -stroke -suicidal thoughts, plans, or attempt; a previous suicide attempt by you or a family member -an unusual or allergic reaction to varenicline, other medicines, foods, dyes, or preservatives -pregnant or trying to get pregnant -breast-feeding How should I use this medicine? You should set a date to stop smoking and tell your doctor. Start this medicine one week before the quit date. You can also start taking this medicine before you choose a quit date, and then pick a quit date that is between 8 and 35 days of treatment with this medicine. Stick to your plan; ask about support groups or other ways to help you remain a 'quitter'. Take this medicine by mouth after eating. Take with a full glass of water. Follow the directions on the prescription label. Take your doses at regular intervals. Do not take your medicine more often than directed. A special MedGuide will be given to you by the pharmacist with each prescription and refill. Be sure to read this information  carefully each time. Talk to your pediatrician regarding the use of this medicine in children. This medicine is not approved for use in children. Overdosage: If you think you have taken too much of this medicine contact a poison control center or emergency room at once. NOTE: This medicine is only for you. Do not share this medicine with others. What if I miss a dose? If you miss a dose, take it as soon as you can. If it is almost time for your next dose, take only that dose. Do not take double or extra doses. What may interact with this medicine? -insulin -other stop smoking aids -theophylline -warfarin This list may not describe all possible interactions. Give your health care  provider a list of all the medicines, herbs, non-prescription drugs, or dietary supplements you use. Also tell them if you smoke, drink alcohol, or use illegal drugs. Some items may interact with your medicine. What should I watch for while using this medicine? Visit your doctor or health care professional for regular check ups. Ask for ongoing advice and encouragement from your doctor or healthcare professional, friends, and family to help you quit. If you smoke while on this medication, quit again Your mouth may get dry. Chewing sugarless gum or sucking hard candy, and drinking plenty of water may help. Contact your doctor if the problem does not go away or is severe. You may get drowsy or dizzy. Do not drive, use machinery, or do anything that needs mental alertness until you know how this medicine affects you. Do not stand or sit up quickly, especially if you are an older patient. This reduces the risk of dizzy or fainting spells. The use of this medicine may increase the chance of suicidal thoughts or actions. Pay special attention to how you are responding while on this medicine. Any worsening of mood, or thoughts of suicide or dying should be reported to your health care professional right away. What side effects may  I notice from receiving this medicine? Side effects that you should report to your doctor or health care professional as soon as possible: -allergic reactions like skin rash, itching or hives, swelling of the face, lips, tongue, or throat -breathing problems -changes in vision -chest pain or chest tightness -confusion, trouble speaking or understanding -fast, irregular heartbeat -feeling faint or lightheaded, falls -fever -pain in legs when walking -problems with balance, talking, walking -ringing in ears -sudden numbness or weakness of the face, arm or leg -suicidal thoughts or other mood changes -trouble passing urine or change in the amount of urine -unusual bleeding or bruising -unusually weak or tired Side effects that usually do not require medical attention (report to your doctor or health care professional if they continue or are bothersome): -constipation -headache -nausea, vomiting -strange dreams -stomach gas -trouble sleeping This list may not describe all possible side effects. Call your doctor for medical advice about side effects. You may report side effects to FDA at 1-800-FDA-1088. Where should I keep my medicine? Keep out of the reach of children. Store at room temperature between 15 and 30 degrees C (59 and 86 degrees F). Throw away any unused medicine after the expiration date. NOTE: This sheet is a summary. It may not cover all possible information. If you have questions about this medicine, talk to your doctor, pharmacist, or health care provider.  2013, [employer]. (02/28/2010 3:12:38 PM)

## 2013-06-02 ENCOUNTER — Telehealth: Payer: Self-pay

## 2013-06-02 NOTE — Telephone Encounter (Signed)
Message copied by Eulis Manly on Mon Jun 02, 2013  9:19 AM ------      Message from: Lorre Munroe      Created: Mon Jun 02, 2013  8:51 AM       Please call pt and let her know all of her labs are normal. We can recheck in 1 year ------

## 2013-06-02 NOTE — Telephone Encounter (Signed)
Patient informed of lab results. 

## 2018-12-23 ENCOUNTER — Encounter: Payer: Self-pay | Admitting: Family

## 2018-12-23 ENCOUNTER — Other Ambulatory Visit (INDEPENDENT_AMBULATORY_CARE_PROVIDER_SITE_OTHER): Payer: 59

## 2018-12-23 ENCOUNTER — Other Ambulatory Visit: Payer: Self-pay

## 2018-12-23 ENCOUNTER — Ambulatory Visit (INDEPENDENT_AMBULATORY_CARE_PROVIDER_SITE_OTHER): Payer: 59 | Admitting: Family

## 2018-12-23 VITALS — BP 128/82 | HR 81 | Temp 98.6°F | Ht 65.5 in | Wt 184.1 lb

## 2018-12-23 DIAGNOSIS — Z0000 Encounter for general adult medical examination without abnormal findings: Secondary | ICD-10-CM

## 2018-12-23 DIAGNOSIS — G479 Sleep disorder, unspecified: Secondary | ICD-10-CM | POA: Diagnosis not present

## 2018-12-23 DIAGNOSIS — Z13220 Encounter for screening for lipoid disorders: Secondary | ICD-10-CM

## 2018-12-23 DIAGNOSIS — Z114 Encounter for screening for human immunodeficiency virus [HIV]: Secondary | ICD-10-CM

## 2018-12-23 LAB — VITAMIN D 25 HYDROXY (VIT D DEFICIENCY, FRACTURES): VITD: <7 ng/mL — ABNORMAL LOW (ref 30.00–100.00)

## 2018-12-23 LAB — CBC WITH DIFFERENTIAL/PLATELET
Basophils Absolute: 0.1 10*3/uL (ref 0.0–0.1)
Basophils Relative: 0.8 % (ref 0.0–3.0)
Eosinophils Absolute: 0.3 10*3/uL (ref 0.0–0.7)
Eosinophils Relative: 3 % (ref 0.0–5.0)
HCT: 42.2 % (ref 36.0–46.0)
Hemoglobin: 13.8 g/dL (ref 12.0–15.0)
Lymphocytes Relative: 22.7 % (ref 12.0–46.0)
Lymphs Abs: 2.5 10*3/uL (ref 0.7–4.0)
MCHC: 32.8 g/dL (ref 30.0–36.0)
MCV: 81.6 fl (ref 78.0–100.0)
Monocytes Absolute: 0.5 10*3/uL (ref 0.1–1.0)
Monocytes Relative: 5 % (ref 3.0–12.0)
Neutro Abs: 7.4 10*3/uL (ref 1.4–7.7)
Neutrophils Relative %: 68.5 % (ref 43.0–77.0)
Platelets: 248 10*3/uL (ref 150.0–400.0)
RBC: 5.17 Mil/uL — ABNORMAL HIGH (ref 3.87–5.11)
RDW: 14.9 % (ref 11.5–15.5)
WBC: 10.8 10*3/uL — ABNORMAL HIGH (ref 4.0–10.5)

## 2018-12-23 LAB — COMPREHENSIVE METABOLIC PANEL
ALT: 14 U/L (ref 0–35)
AST: 14 U/L (ref 0–37)
Albumin: 3.9 g/dL (ref 3.5–5.2)
Alkaline Phosphatase: 58 U/L (ref 39–117)
BUN: 6 mg/dL (ref 6–23)
CO2: 26 mEq/L (ref 19–32)
Calcium: 8.6 mg/dL (ref 8.4–10.5)
Chloride: 108 mEq/L (ref 96–112)
Creatinine, Ser: 0.75 mg/dL (ref 0.40–1.20)
GFR: 84.18 mL/min (ref >60.00)
Glucose, Bld: 97 mg/dL (ref 70–99)
Potassium: 4.2 mEq/L (ref 3.5–5.1)
Sodium: 140 mEq/L (ref 135–145)
Total Bilirubin: 0.4 mg/dL (ref 0.2–1.2)
Total Protein: 6.7 g/dL (ref 6.0–8.3)

## 2018-12-23 LAB — LIPID PANEL
Cholesterol: 192 mg/dL (ref 0–200)
HDL: 42.1 mg/dL (ref >39.00)
LDL Cholesterol: 137 mg/dL — ABNORMAL HIGH (ref 0–99)
NonHDL: 150.36
Total CHOL/HDL Ratio: 5
Triglycerides: 66 mg/dL (ref 0.0–149.0)
VLDL: 13.2 mg/dL (ref 0.0–40.0)

## 2018-12-23 LAB — TSH: TSH: 2.44 u[IU]/mL (ref 0.35–4.50)

## 2018-12-23 NOTE — Patient Instructions (Signed)
Steps to Quit Smoking  Smoking tobacco can be harmful to your health and can affect almost every organ in your body. Smoking puts you, and those around you, at risk for developing many serious chronic diseases. Quitting smoking is difficult, but it is one of the best things that you can do for your health. It is never too late to quit. What are the benefits of quitting smoking? When you quit smoking, you lower your risk of developing serious diseases and conditions, such as:  Lung cancer or lung disease, such as COPD.  Heart disease.  Stroke.  Heart attack.  Infertility.  Osteoporosis and bone fractures. Additionally, symptoms such as coughing, wheezing, and shortness of breath may get better when you quit. You may also find that you get sick less often because your body is stronger at fighting off colds and infections. If you are pregnant, quitting smoking can help to reduce your chances of having a baby of low birth weight. How do I get ready to quit? When you decide to quit smoking, create a plan to make sure that you are successful. Before you quit:  Pick a date to quit. Set a date within the next two weeks to give you time to prepare.  Write down the reasons why you are quitting. Keep this list in places where you will see it often, such as on your bathroom mirror or in your car or wallet.  Identify the people, places, things, and activities that make you want to smoke (triggers) and avoid them. Make sure to take these actions: ? Throw away all cigarettes at home, at work, and in your car. ? Throw away smoking accessories, such as ashtrays and lighters. ? Clean your car and make sure to empty the ashtray. ? Clean your home, including curtains and carpets.  Tell your family, friends, and coworkers that you are quitting. Support from your loved ones can make quitting easier.  Talk with your health care provider about your options for quitting smoking.  Find out what treatment  options are covered by your health insurance. What strategies can I use to quit smoking? Talk with your healthcare provider about different strategies to quit smoking. Some strategies include:  Quitting smoking altogether instead of gradually lessening how much you smoke over a period of time. Research shows that quitting "cold turkey" is more successful than gradually quitting.  Attending in-person counseling to help you build problem-solving skills. You are more likely to have success in quitting if you attend several counseling sessions. Even short sessions of 10 minutes can be effective.  Finding resources and support systems that can help you to quit smoking and remain smoke-free after you quit. These resources are most helpful when you use them often. They can include: ? Online chats with a counselor. ? Telephone quitlines. ? Printed self-help materials. ? Support groups or group counseling. ? Text messaging programs. ? Mobile phone applications.  Taking medicines to help you quit smoking. (If you are pregnant or breastfeeding, talk with your health care provider first.) Some medicines contain nicotine and some do not. Both types of medicines help with cravings, but the medicines that include nicotine help to relieve withdrawal symptoms. Your health care provider may recommend: ? Nicotine patches, gum, or lozenges. ? Nicotine inhalers or sprays. ? Non-nicotine medicine that is taken by mouth. Talk with your health care provider about combining strategies, such as taking medicines while you are also receiving in-person counseling. Using these two strategies together makes you   more likely to succeed in quitting than if you used either strategy on its own. If you are pregnant or breastfeeding, talk with your health care provider about finding counseling or other support strategies to quit smoking. Do not take medicine to help you quit smoking unless told to do so by your health care  provider. What things can I do to make it easier to quit? Quitting smoking might feel overwhelming at first, but there is a lot that you can do to make it easier. Take these important actions:  Reach out to your family and friends and ask that they support and encourage you during this time. Call telephone quitlines, reach out to support groups, or work with a counselor for support.  Ask people who smoke to avoid smoking around you.  Avoid places that trigger you to smoke, such as bars, parties, or smoke-break areas at work.  Spend time around people who do not smoke.  Lessen stress in your life, because stress can be a smoking trigger for some people. To lessen stress, try: ? Exercising regularly. ? Deep-breathing exercises. ? Yoga. ? Meditating. ? Performing a body scan. This involves closing your eyes, scanning your body from head to toe, and noticing which parts of your body are particularly tense. Purposefully relax the muscles in those areas.  Download or purchase mobile phone or tablet apps (applications) that can help you stick to your quit plan by providing reminders, tips, and encouragement. There are many free apps, such as QuitGuide from the CDC (Centers for Disease Control and Prevention). You can find other support for quitting smoking (smoking cessation) through smokefree.gov and other websites. How will I feel when I quit smoking? Within the first 24 hours of quitting smoking, you may start to feel some withdrawal symptoms. These symptoms are usually most noticeable 2-3 days after quitting, but they usually do not last beyond 2-3 weeks. Changes or symptoms that you might experience include:  Mood swings.  Restlessness, anxiety, or irritation.  Difficulty concentrating.  Dizziness.  Strong cravings for sugary foods in addition to nicotine.  Mild weight gain.  Constipation.  Nausea.  Coughing or a sore throat.  Changes in how your medicines work in your  body.  A depressed mood.  Difficulty sleeping (insomnia). After the first 2-3 weeks of quitting, you may start to notice more positive results, such as:  Improved sense of smell and taste.  Decreased coughing and sore throat.  Slower heart rate.  Lower blood pressure.  Clearer skin.  The ability to breathe more easily.  Fewer sick days. Quitting smoking is very challenging for most people. Do not get discouraged if you are not successful the first time. Some people need to make many attempts to quit before they achieve long-term success. Do your best to stick to your quit plan, and talk with your health care provider if you have any questions or concerns. This information is not intended to replace advice given to you by your health care provider. Make sure you discuss any questions you have with your health care provider. Document Released: 07/18/2001 Document Revised: 02/27/2017 Document Reviewed: 12/08/2014 [employer] Interactive Patient Education  2019 [employer]  

## 2018-12-23 NOTE — Progress Notes (Signed)
Ambyr Qadri is a 43 y.o. female with the following history as recorded in EpicCare:  There are no active problems to display for this patient.   No current outpatient medications on file.   No current facility-administered medications for this visit.     Allergies: Penicillins and Zyrtec [cetirizine]  Past Medical History:  Diagnosis Date  . Allergy   . Headache   . History of blood transfusion   . History of stomach ulcers   . Hx of migraines   . [place] spotted fever     Past Surgical History:  Procedure Laterality Date  . CHOLECYSTECTOMY    . reconstructive surgery    . [place] fever      Family History  Problem Relation Age of Onset  . Alcohol abuse Father   . Cancer Father        colon/pancreatic  . Diabetes Father   . Heart disease Father   . Hyperlipidemia Father   . Hypertension Father   . Kidney disease Father   . Stroke Father   . Cancer Sister        ovarian    Social History   Tobacco Use  . Smoking status: Smoker, Current Status Unknown    Packs/day: 0.50    Types: Cigarettes  Substance Use Topics  . 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   HENT: Negative.   Eyes: Negative.   Respiratory: Negative.   Cardiovascular: Negative.   Gastrointestinal: Negative.   Genitourinary: Negative.   Musculoskeletal: Negative.   Skin: Negative.   Neurological: Negative.   Endo/Heme/Allergies: Negative.   Psychiatric/Behavioral: Negative.      Objective:  Vitals:   12/23/18 1035  BP: 128/82  Pulse: 81  Temp: 98.6 F  (37 C)  TempSrc: Oral  SpO2: 98%  Weight: 184 lb 1.9 oz (83.5 kg)  Height: 5' 5.5" (1.664 m)    General: Well developed, well nourished, in no acute distress  Skin : Warm and dry.  Head: Normocephalic and atraumatic  Eyes: Sclera and conjunctiva clear; pupils round and reactive to light; extraocular movements intact  Ears: External normal; canals clear; tympanic membranes normal  Oropharynx: Pink, supple. No suspicious lesions  Neck: Supple without thyromegaly, adenopathy  Lungs: Respirations unlabored; clear to auscultation bilaterally without wheeze, rales, rhonchi  CVS exam: normal rate and regular rhythm.  Abdomen: Soft; nontender; nondistended; normoactive bowel sounds; no masses or hepatosplenomegaly  Musculoskeletal: No deformities; no active joint inflammation  Extremities: No edema, cyanosis, clubbing  Vessels: Symmetric bilaterally  Neurologic: Alert and oriented; speech intact; face symmetrical; moves all extremities well; CNII-XII intact without focal deficit   Assessment:  1. PE (physical exam), annual   2. Lipid screening   3. Encounter for screening for HIV   4. Disturbance in sleep behavior     Plan:  Age appropriate preventive healthcare needs addressed; encouraged regular eye doctor and dental exams; encouraged regular exercise; will update labs and refills as needed today; follow-up to be determined; Stressed need to quit smoking- discussed 1-800-QUIT NOW; Refer  to sleep specialist to discuss possible need for sleep study;    No follow-ups on file.  Orders Placed This Encounter  Procedures  . CBC w/Diff    Standing Status:   Future    Standing Expiration Date:   12/23/2019  . Comp Met (CMET)    Standing Status:   Future    Standing Expiration Date:   12/23/2019  . Lipid panel    Standing Status:   Future    Standing Expiration Date:   12/23/2019  . TSH    Standing Status:   Future    Standing Expiration Date:   12/23/2019  . Vitamin D (25 hydroxy)     Standing Status:   Future    Standing Expiration Date:   12/23/2019  . HIV Antibody (routine testing w rflx)    Standing Status:   Future    Standing Expiration Date:   12/23/2019  . Ambulatory referral to Neurology    Referral Priority:   Routine    Referral Type:   Consultation    Referral Reason:   Specialty Services Required    Requested Specialty:   Neurology    Number of Visits Requested:   1    Requested Prescriptions    No prescriptions requested or ordered in this encounter

## 2018-12-24 ENCOUNTER — Ambulatory Visit: Payer: Self-pay | Admitting: Family

## 2018-12-24 LAB — HIV ANTIBODY (ROUTINE TESTING W REFLEX): HIV 1&2 Ab, 4th Generation: NONREACTIVE

## 2018-12-27 ENCOUNTER — Other Ambulatory Visit: Payer: Self-pay | Admitting: Family

## 2018-12-27 DIAGNOSIS — E559 Vitamin D deficiency, unspecified: Secondary | ICD-10-CM

## 2019-01-01 ENCOUNTER — Ambulatory Visit (INDEPENDENT_AMBULATORY_CARE_PROVIDER_SITE_OTHER): Payer: 59 | Admitting: Neurology

## 2019-01-01 ENCOUNTER — Other Ambulatory Visit: Payer: Self-pay

## 2019-01-01 ENCOUNTER — Encounter: Payer: Self-pay | Admitting: Neurology

## 2019-01-01 VITALS — BP 127/82 | HR 76 | Temp 97.3°F | Ht 65.0 in | Wt 183.0 lb

## 2019-01-01 DIAGNOSIS — G4700 Insomnia, unspecified: Secondary | ICD-10-CM

## 2019-01-01 DIAGNOSIS — F17200 Nicotine dependence, unspecified, uncomplicated: Secondary | ICD-10-CM

## 2019-01-01 DIAGNOSIS — G479 Sleep disorder, unspecified: Secondary | ICD-10-CM

## 2019-01-01 DIAGNOSIS — R51 Headache: Secondary | ICD-10-CM

## 2019-01-01 DIAGNOSIS — G478 Other sleep disorders: Secondary | ICD-10-CM

## 2019-01-01 DIAGNOSIS — R519 Headache, unspecified: Secondary | ICD-10-CM

## 2019-01-01 DIAGNOSIS — E669 Obesity, unspecified: Secondary | ICD-10-CM

## 2019-01-01 NOTE — Patient Instructions (Signed)
Based on your symptoms and your exam I believe we should look for an underlying organic sleep disorder, such as obstructive sleep apnea (OSA) with a sleep study.  If you have more than mild OSA, I want you to consider treatment with CPAP. Please remember, the risks and ramifications of moderate to severe obstructive sleep apnea or OSA are: Cardiovascular disease, including congestive heart failure, stroke, difficult to control hypertension, arrhythmias, and even type 2 diabetes has been linked to untreated OSA. Sleep apnea causes disruption of sleep and sleep deprivation in most cases, which, in turn, can cause recurrent headaches, problems with memory, mood, concentration, focus, and vigilance. Most people with untreated sleep apnea report excessive daytime sleepiness, which can affect their ability to drive. Please do not drive if you feel sleepy.   We will call you after your sleep study to advise about the results (most likely, you will hear from Cyril Mourning, my nurse).    Our sleep lab administrative assistant, will call you to schedule your sleep study. If you don't hear back from her by about 2 weeks from now, please feel free to call her at [phone]. This is her direct line and please leave a message with your phone number to call back if you get the voicemail box. She will call back as soon as possible.   Your chronic insomnia may be treated with cognitive behavioral therapy (CBT-I). Please talk to Jodi Mourning about it. You may benefit from counseling too.
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
## 2019-01-08 ENCOUNTER — Other Ambulatory Visit (INDEPENDENT_AMBULATORY_CARE_PROVIDER_SITE_OTHER): Payer: 59

## 2019-01-08 DIAGNOSIS — E559 Vitamin D deficiency, unspecified: Secondary | ICD-10-CM

## 2019-01-08 LAB — VITAMIN D 25 HYDROXY (VIT D DEFICIENCY, FRACTURES): VITD: 12.77 ng/mL — ABNORMAL LOW (ref 30.00–100.00)

## 2019-01-10 ENCOUNTER — Other Ambulatory Visit: Payer: Self-pay | Admitting: Family

## 2019-01-10 DIAGNOSIS — E559 Vitamin D deficiency, unspecified: Secondary | ICD-10-CM

## 2019-01-10 MED ORDER — VITAMIN D (ERGOCALCIFEROL) 1.25 MG (50000 UNIT) PO CAPS: 50000.0000 [IU] | ORAL_CAPSULE | ORAL | 0 refills | Status: AC

## 2019-02-12 ENCOUNTER — Ambulatory Visit (INDEPENDENT_AMBULATORY_CARE_PROVIDER_SITE_OTHER): Payer: 59 | Admitting: Neurology

## 2019-02-12 DIAGNOSIS — E669 Obesity, unspecified: Secondary | ICD-10-CM

## 2019-02-12 DIAGNOSIS — G479 Sleep disorder, unspecified: Secondary | ICD-10-CM

## 2019-02-12 DIAGNOSIS — F17200 Nicotine dependence, unspecified, uncomplicated: Secondary | ICD-10-CM

## 2019-02-12 DIAGNOSIS — G4733 Obstructive sleep apnea (adult) (pediatric): Secondary | ICD-10-CM | POA: Diagnosis not present

## 2019-02-12 DIAGNOSIS — R519 Headache, unspecified: Secondary | ICD-10-CM

## 2019-02-12 DIAGNOSIS — G4700 Insomnia, unspecified: Secondary | ICD-10-CM

## 2019-02-12 DIAGNOSIS — G478 Other sleep disorders: Secondary | ICD-10-CM

## 2019-02-19 ENCOUNTER — Telehealth: Payer: Self-pay

## 2019-02-19 NOTE — Procedures (Signed)
Patient Information     First Name: [name] Last Name: [name] ID: [id number]  Birth Date: October 27, 1975 Age: 43 Gender: Female  Referring Provider: Minerva Areola BMI: 30.5 (W=183 lb, H=5' 5'')  Neck Circ.:  15 ''    Sleep Study Information    Study Date: Feb 12, 2019 S/H/A Version: [id number] / 4.1.1528 / 27  History:     43 year old woman with a history of allergies, recurrent headaches and history of migraines, [place] spotted fever, remote history of seizure, history of stomach ulcer, and borderline obesity, who reports a longstanding history of difficulty initiating and maintaining sleep.  Summary & Diagnosis:       Mild OSA   Recommendations:      This home sleep test demonstrates mild obstructive sleep apnea with a total AHI of 9.2/hour and O2 nadir of 91%. Snoring appeared to be minimal to mild, at times. She slept in the supine position. Given the patient's medical history and sleep related complaints, treatment with positive airway pressure is a reasonable option and can be achieved in the form of autoPAP titration/trial at home. Other treatment options include weight loss, along with avoidance of the supine sleep position or the use of an oral appliance through a dentist. These options will be discussed with the patient. She will be cautioned not to drive, work at heights, or operate dangerous or heavy equipment when tired or sleepy. Review and reiteration of good sleep hygiene measures should be pursued with any patient. Other causes of the patient's symptoms, including circadian rhythm disturbances, an underlying mood disorder, medication effect and/or an underlying medical problem cannot be ruled out based on this test. Clinical correlation is recommended. The patient and her referring provider will be notified of the test results. The patient will be seen in follow up in sleep clinic at [facility], either for a face-to-face or virtual visit, whichever feasible and recommended at  the time.  I certify that I have reviewed the raw data recording prior to the issuance of this report in accordance with the standards of the American Academy of Sleep Medicine (AASM).  Star Age, MD, PhD [facility]) Diplomat, ABPN (Neurology and Sleep)              Sleep Summary  Oxygen Saturation Statistics   Start Study Time: End Study Time: Total Recording Time:  10:53:19 PM   7:28:20 AM   8 h, 35 min  Total Sleep Time % REM of Sleep Time:  7 h, 44 min  23.6    Mean: 96 Minimum: 91 Maximum: 100  Mean of Desaturations Nadirs (%):   93  Oxygen Desaturation %: 4-9 10-20 >20 Total  Events Number Total  7 100.0  0 0.0  0 0.0  7 100.0  Oxygen Saturation: <90 <=88 <85 <80 <70  Duration (minutes): Sleep % 0.0 0.0 0.0 0.0 0.0 0.0 0.0 0.0 0.0 0.0     Respiratory Indices      Total Events REM NREM All Night  pRDI:  94  pAHI:  71 ODI:  7  pAHIc:  0  % CSR: 0.0 23.2 18.2 2.8 0.0 8.8 6.4 0.3 0.0 12.2 9.2 0.9 0.0       Pulse Rate Statistics during Sleep (BPM)      Mean:  63 Minimum: 40 Maximum: 99    Indices are calculated using technically valid sleep time of  7 hrs, 42 min. pRDI/pAHI are calculated using oxi desaturations ? 3%  Body Position Statistics  Position Supine Prone Right Left Non-Supine  Sleep (min) 464.0 0.0 0.0 0.0 0.0  Sleep % 100.0 0.0 0.0 0.0 0.0  pRDI 12.2 N/A N/A N/A N/A  pAHI 9.2 N/A N/A N/A N/A  ODI 0.9 N/A N/A N/A N/A     Snoring Statistics Snoring Level (dB) >40 >50 >60 >70 >80 >Threshold (45)  Sleep (min) 219.2 4.3 1.3 0.0 0.0 11.8  Sleep % 47.2 0.9 0.3 0.0 0.0 2.5    Mean: 41 dB Sleep Stages Chart              pAHI=9.2                           Mild              Moderate                    Severe                                                    5              15                    30

## 2019-02-19 NOTE — Progress Notes (Signed)
Patient referred by Jodi Mourning, NP, seen by me on 01/01/19, HST on 02/12/19.    Please call and notify the patient that the recent home sleep test showed obstructive sleep apnea. OSA is overall mild, but worth treating to see if she feels better after treatment. Treatment with positive airway pressure is a reasonable option and can be achieved in the form of autoPAP titration/trial at home. Other treatment options include weight loss, along with avoidance of the supine sleep position or the use of an oral appliance through a dentist. Let me know, how she would like to proceed; I can make a referral to dentistry for consideration of an dental device, if she would like.  If we start autoPAP, she will need a FU in 10-12 w.  Star Age, MD, PhD [facility])

## 2019-02-19 NOTE — Telephone Encounter (Signed)
-----   Message from Star Age, MD sent at 02/19/2019  8:35 AM EDT ----- Patient referred by Jodi Mourning, NP, seen by me on 01/01/19, HST on 02/12/19.    Please call and notify the patient that the recent home sleep test showed obstructive sleep apnea. OSA is overall mild, but worth treating to see if she feels better after treatment. Treatment with positive airway pressure is a reasonable option and can be achieved in the form of autoPAP titration/trial at home. Other treatment options include weight loss, along with avoidance of the supine sleep position or the use of an oral appliance through a dentist. Let me know, how she would like to proceed; I can make a referral to dentistry for consideration of an dental device, if she would like.  If we start autoPAP, she will need a FU in 10-12 w.  Star Age, MD, PhD [facility])

## 2019-02-19 NOTE — Telephone Encounter (Signed)
I called pt and discussed her sleep study results and recommendations. Pt wants to speak with her personal dentist about an oral appliance and will let us know if she needs a referral to another dentist. Pt declines auto pap. Pt verbalized understanding of results. Pt had no questions at this time but was encouraged to call back if questions arise.

## 2019-04-02 ENCOUNTER — Other Ambulatory Visit (INDEPENDENT_AMBULATORY_CARE_PROVIDER_SITE_OTHER): Payer: 59

## 2019-04-02 DIAGNOSIS — E559 Vitamin D deficiency, unspecified: Secondary | ICD-10-CM | POA: Diagnosis not present

## 2019-04-02 LAB — VITAMIN D 25 HYDROXY (VIT D DEFICIENCY, FRACTURES): VITD: 30.6 ng/mL (ref 30.00–100.00)

## 2019-10-16 ENCOUNTER — Other Ambulatory Visit: Payer: Self-pay | Admitting: Obstetrics and Gynecology

## 2019-10-16 DIAGNOSIS — R928 Other abnormal and inconclusive findings on diagnostic imaging of breast: Secondary | ICD-10-CM

## 2019-10-29 ENCOUNTER — Ambulatory Visit
Admission: RE | Admit: 2019-10-29 | Discharge: 2019-10-29 | Disposition: A | Discharge status: Home / Self Care | Payer: 59 | Source: Ambulatory Visit | Attending: Obstetrics and Gynecology | Admitting: Obstetrics and Gynecology

## 2019-10-29 ENCOUNTER — Other Ambulatory Visit: Payer: Self-pay

## 2019-10-29 DIAGNOSIS — R928 Other abnormal and inconclusive findings on diagnostic imaging of breast: Secondary | ICD-10-CM

## 2020-04-19 ENCOUNTER — Ambulatory Visit (INDEPENDENT_AMBULATORY_CARE_PROVIDER_SITE_OTHER): Payer: 59 | Admitting: Family

## 2020-04-19 ENCOUNTER — Other Ambulatory Visit: Payer: Self-pay

## 2020-04-19 ENCOUNTER — Other Ambulatory Visit: Payer: Self-pay | Admitting: Family

## 2020-04-19 ENCOUNTER — Encounter: Payer: Self-pay | Admitting: Family

## 2020-04-19 VITALS — BP 138/76 | HR 105 | Temp 98.8°F | Ht 65.0 in | Wt 174.8 lb

## 2020-04-19 DIAGNOSIS — Z86018 Personal history of other benign neoplasm: Secondary | ICD-10-CM

## 2020-04-19 DIAGNOSIS — Z0000 Encounter for general adult medical examination without abnormal findings: Secondary | ICD-10-CM

## 2020-04-19 DIAGNOSIS — Z13220 Encounter for screening for lipoid disorders: Secondary | ICD-10-CM

## 2020-04-19 DIAGNOSIS — E559 Vitamin D deficiency, unspecified: Secondary | ICD-10-CM

## 2020-04-19 DIAGNOSIS — N644 Mastodynia: Secondary | ICD-10-CM | POA: Diagnosis not present

## 2020-04-19 NOTE — Patient Instructions (Signed)
Coping with Quitting Smoking  Quitting smoking is a physical and mental challenge. You will face cravings, withdrawal symptoms, and temptation. Before quitting, work with your health care provider to make a plan that can help you cope. Preparation can help you quit and keep you from giving in. How can I cope with cravings? Cravings usually last for 5-10 minutes. If you get through it, the craving will pass. Consider taking the following actions to help you cope with cravings:  Keep your mouth busy: ? Chew sugar-free gum. ? Suck on hard candies or a straw. ? Brush your teeth.  Keep your hands and body busy: ? Immediately change to a different activity when you feel a craving. ? Squeeze or play with a ball. ? Do an activity or a hobby, like making bead jewelry, practicing needlepoint, or working with wood. ? Mix up your normal routine. ? Take a short exercise break. Go for a quick walk or run up and down stairs. ? Spend time in public places where smoking is not allowed.  Focus on doing something kind or helpful for someone else.  Call a friend or family member to talk during a craving.  Join a support group.  Call a quit line, such as 1-800-QUIT-NOW.  Talk with your health care provider about medicines that might help you cope with cravings and make quitting easier for you. How can I deal with withdrawal symptoms? Your body may experience negative effects as it tries to get used to not having nicotine in the system. These effects are called withdrawal symptoms. They may include:  Feeling hungrier than normal.  Trouble concentrating.  Irritability.  Trouble sleeping.  Feeling depressed.  Restlessness and agitation.  Craving a cigarette. To manage withdrawal symptoms:  Avoid places, people, and activities that trigger your cravings.  Remember why you want to quit.  Get plenty of sleep.  Avoid coffee and other caffeinated drinks. These may worsen some of your  symptoms. How can I handle social situations? Social situations can be difficult when you are quitting smoking, especially in the first few weeks. To manage this, you can:  Avoid parties, bars, and other social situations where people might be smoking.  Avoid alcohol.  Leave right away if you have the urge to smoke.  Explain to your family and friends that you are quitting smoking. Ask for understanding and support.  Plan activities with friends or family where smoking is not an option. What are some ways I can cope with stress? Wanting to smoke may cause stress, and stress can make you want to smoke. Find ways to manage your stress. Relaxation techniques can help. For example:  Breathe slowly and deeply, in through your nose and out through your mouth.  Listen to soothing, relaxing music.  Talk with a family member or friend about your stress.  Light a candle.  Soak in a bath or take a shower.  Think about a peaceful place. What are some ways I can prevent weight gain? Be aware that many people gain weight after they quit smoking. However, not everyone does. To keep from gaining weight, have a plan in place before you quit and stick to the plan after you quit. Your plan should include:  Having healthy snacks. When you have a craving, it may help to: ? Eat plain popcorn, crunchy carrots, celery, or other cut vegetables. ? Chew sugar-free gum.  Changing how you eat: ? Eat small portion sizes at meals. ? Eat 4-6 small meals   throughout the day instead of 1-2 large meals a day. ? Be mindful when you eat. Do not watch television or do other things that might distract you as you eat.  Exercising regularly: ? Make time to exercise each day. If you do not have time for a long workout, do short bouts of exercise for 5-10 minutes several times a day. ? Do some form of strengthening exercise, like weight lifting, and some form of aerobic exercise, like running or swimming.  Drinking  plenty of water or other low-calorie or no-calorie drinks. Drink 6-8 glasses of water daily, or as much as instructed by your health care provider. Summary  Quitting smoking is a physical and mental challenge. You will face cravings, withdrawal symptoms, and temptation to smoke again. Preparation can help you as you go through these challenges.  You can cope with cravings by keeping your mouth busy (such as by chewing gum), keeping your body and hands busy, and making calls to family, friends, or a helpline for people who want to quit smoking.  You can cope with withdrawal symptoms by avoiding places where people smoke, avoiding drinks with caffeine, and getting plenty of rest.  Ask your health care provider about the different ways to prevent weight gain, avoid stress, and handle social situations. This information is not intended to replace advice given to you by your health care provider. Make sure you discuss any questions you have with your health care provider. Document Revised: 07/06/2017 Document Reviewed: 07/21/2016 Elsevier Patient Education  2020 [employer] Inc[employer]  

## 2020-04-19 NOTE — Progress Notes (Signed)
[name] [name] is a 44 y.o. female with the following history as recorded in EpicCare:  There are no problems to display for this patient.   Current Outpatient Medications  Medication Sig Dispense Refill  . levonorgestrel (MIRENA) 20 MCG/24HR IUD 1 each by Intrauterine route once.    . Multiple Vitamin (MULTIVITAMIN) tablet Take 1 tablet by mouth daily.     No current facility-administered medications for this visit.    Allergies: Penicillins and Zyrtec [cetirizine]  Past Medical History:  Diagnosis Date  . Allergy   . Headache   . History of blood transfusion   . History of stomach ulcers   . Hx of migraines   . [place] spotted fever     Past Surgical History:  Procedure Laterality Date  . CHOLECYSTECTOMY    . reconstructive surgery    . [place] fever      Family History  Problem Relation Age of Onset  . Alcohol abuse Father   . Cancer Father        colon/pancreatic  . Diabetes Father   . Heart disease Father   . Hyperlipidemia Father   . Hypertension Father   . Kidney disease Father   . Stroke Father   . Cancer Sister        ovarian    Social History   Tobacco Use  . Smoking status: Smoker, Current Status Unknown    Packs/day: 0.50    Types: Cigarettes  . Smokeless tobacco: Never Used  Substance Use Topics  . Alcohol use: Yes    Comment: occasionally/socially    Subjective:  Presents for yearly CPE; has lost 9 pounds in the past year; would like to see plastic surgeon to discuss breast reduction.  Sees eye doctor and dentist regularly;  Review of Systems  Constitutional: Positive for weight loss.       Planned  HENT: Negative.   Eyes: Negative.   Respiratory: Negative.   Cardiovascular: Negative.   Gastrointestinal: Negative.   Genitourinary: Negative.   Musculoskeletal: Negative.   Skin: Negative.   Neurological: Negative.   Endo/Heme/Allergies: Negative.   Psychiatric/Behavioral: Negative.        Objective:  Vitals:    04/19/20 1307  BP: 138/76  Pulse: (!) 105  Temp: 98.8 F (37.1 C)  TempSrc: Oral  SpO2: 99%  Weight: 174 lb 12.8 oz (79.3 kg)  Height: 5' 5" (1.651 m)    General: Well developed, well nourished, in no acute distress  Skin : Warm and dry.  Head: Normocephalic and atraumatic  Eyes: Sclera and conjunctiva clear; pupils round and reactive to light; extraocular movements intact  Ears: External normal; canals clear; tympanic membranes normal  Oropharynx: Pink, supple. No suspicious lesions  Neck: Supple without thyromegaly, adenopathy  Lungs: Respirations unlabored; clear to auscultation bilaterally without wheeze, rales, rhonchi  CVS exam: normal rate and regular rhythm.  Abdomen: Soft; nontender; nondistended; normoactive bowel sounds; no masses or hepatosplenomegaly  Musculoskeletal: No deformities; no active joint inflammation  Extremities: No edema, cyanosis, clubbing  Vessels: Symmetric bilaterally  Neurologic: Alert and oriented; speech intact; face symmetrical; moves all extremities well; CNII-XII intact without focal deficit  Assessment:  1. PE (physical exam), annual   2. Breast pain   3. History of changing skin mole   4. Lipid screening   5. Vitamin D deficiency     Plan:  Age appropriate preventive healthcare needs addressed; encouraged regular eye doctor and dental exams; encouraged regular exercise; will update labs and refills  as needed today; follow-up to be determined; Encouraged to quit smoking completely- suspect this will help with her blood pressure as well; Referrals updated as requested;  This visit occurred during the SARS-CoV-2 public health emergency.  Safety protocols were in place, including screening questions prior to the visit, additional usage of staff PPE, and extensive cleaning of exam room while observing appropriate contact time as indicated for disinfecting solutions.     No follow-ups on file.  Orders Placed This Encounter  Procedures  . CBC     Standing Status:   Future    Standing Expiration Date:   04/19/2021  . Comp Met (CMET)    Standing Status:   Future    Standing Expiration Date:   04/19/2021  . Lipid panel    Standing Status:   Future    Standing Expiration Date:   04/19/2021  . TSH    Standing Status:   Future    Standing Expiration Date:   04/19/2021  . Vitamin D (25 hydroxy)    Standing Status:   Future    Standing Expiration Date:   04/19/2021  . Ambulatory referral to Plastic Surgery    Referral Priority:   Routine    Referral Type:   Surgical    Referral Reason:   Specialty Services Required    Requested Specialty:   Plastic Surgery    Number of Visits Requested:   1  . Ambulatory referral to Dermatology    Referral Priority:   Routine    Referral Type:   Consultation    Referral Reason:   Specialty Services Required    Requested Specialty:   Dermatology    Number of Visits Requested:   1    Requested Prescriptions    No prescriptions requested or ordered in this encounter

## 2020-04-20 ENCOUNTER — Other Ambulatory Visit: Payer: Self-pay | Admitting: Family

## 2020-04-20 ENCOUNTER — Telehealth: Payer: Self-pay | Admitting: Family

## 2020-04-20 DIAGNOSIS — D72829 Elevated white blood cell count, unspecified: Secondary | ICD-10-CM

## 2020-04-20 LAB — CBC
HCT: 48 % — ABNORMAL HIGH (ref 35.0–45.0)
Hemoglobin: 15.1 g/dL (ref 11.7–15.5)
MCH: 26.4 pg — ABNORMAL LOW (ref 27.0–33.0)
MCHC: 31.5 g/dL — ABNORMAL LOW (ref 32.0–36.0)
MCV: 83.8 fL (ref 80.0–100.0)
MPV: 11 fL (ref 7.5–12.5)
Platelets: 270 10*3/uL (ref 140–400)
RBC: 5.73 10*6/uL — ABNORMAL HIGH (ref 3.80–5.10)
RDW: 13 % (ref 11.0–15.0)
WBC: 12.7 10*3/uL — ABNORMAL HIGH (ref 3.8–10.8)

## 2020-04-20 LAB — TSH: TSH: 1.47 mIU/L

## 2020-04-20 LAB — LIPID PANEL
Cholesterol: 191 mg/dL (ref <200)
HDL: 37 mg/dL — ABNORMAL LOW (ref >=50)
LDL Cholesterol (Calc): 133 mg/dL (calc) — ABNORMAL HIGH
Non-HDL Cholesterol (Calc): 154 mg/dL (calc) — ABNORMAL HIGH (ref <130)
Total CHOL/HDL Ratio: 5.2 (calc) — ABNORMAL HIGH (ref <5.0)
Triglycerides: 100 mg/dL (ref <150)

## 2020-04-20 LAB — COMPREHENSIVE METABOLIC PANEL
AG Ratio: 1.6 (calc) (ref 1.0–2.5)
ALT: 9 U/L (ref 6–29)
AST: 15 U/L (ref 10–30)
Albumin: 4.4 g/dL (ref 3.6–5.1)
Alkaline phosphatase (APISO): 61 U/L (ref 31–125)
BUN/Creatinine Ratio: 7 (calc) (ref 6–22)
BUN: 6 mg/dL — ABNORMAL LOW (ref 7–25)
CO2: 25 mmol/L (ref 20–32)
Calcium: 9.7 mg/dL (ref 8.6–10.2)
Chloride: 105 mmol/L (ref 98–110)
Creat: 0.84 mg/dL (ref 0.50–1.10)
Globulin: 2.7 g/dL (calc) (ref 1.9–3.7)
Glucose, Bld: 83 mg/dL (ref 65–99)
Potassium: 4.1 mmol/L (ref 3.5–5.3)
Sodium: 140 mmol/L (ref 135–146)
Total Bilirubin: 0.6 mg/dL (ref 0.2–1.2)
Total Protein: 7.1 g/dL (ref 6.1–8.1)

## 2020-04-20 LAB — VITAMIN D 25 HYDROXY (VIT D DEFICIENCY, FRACTURES): Vit D, 25-Hydroxy: 13 ng/mL — ABNORMAL LOW (ref 30–100)

## 2020-04-20 NOTE — Telephone Encounter (Signed)
° ° °  Please return call to patient to discuss lab results °

## 2020-05-17 ENCOUNTER — Encounter: Payer: Self-pay | Admitting: Family

## 2020-05-17 ENCOUNTER — Other Ambulatory Visit: Payer: Self-pay

## 2020-05-17 ENCOUNTER — Ambulatory Visit (INDEPENDENT_AMBULATORY_CARE_PROVIDER_SITE_OTHER): Payer: 59 | Admitting: Family

## 2020-05-17 VITALS — BP 140/90 | HR 78 | Temp 98.7°F | Ht 65.0 in | Wt 183.0 lb

## 2020-05-17 DIAGNOSIS — M62838 Other muscle spasm: Secondary | ICD-10-CM

## 2020-05-17 MED ORDER — PREDNISONE 20 MG PO TABS: 20.0000 mg | ORAL_TABLET | Freq: Every day | ORAL | 0 refills | Status: DC

## 2020-05-17 MED ORDER — KETOROLAC TROMETHAMINE 30 MG/ML IJ SOLN
30.0000 mg | Freq: Once | INTRAMUSCULAR | Status: AC
  Administered 2020-05-17: 30 mg via INTRAVENOUS

## 2020-05-17 MED ORDER — METHOCARBAMOL 500 MG PO TABS: 500.0000 mg | ORAL_TABLET | Freq: Three times a day (TID) | ORAL | 0 refills | Status: DC | PRN

## 2020-05-17 MED ORDER — METHYLPREDNISOLONE ACETATE 40 MG/ML IJ SUSP
40.0000 mg | Freq: Once | INTRAMUSCULAR | Status: AC
  Administered 2020-05-17: 40 mg via INTRAMUSCULAR

## 2020-05-17 NOTE — Progress Notes (Signed)
[name] [name] is a 44 y.o. female with the following history as recorded in EpicCare:  There are no problems to display for this patient.   Current Outpatient Medications  Medication Sig Dispense Refill  . Influenza Virus Vaccine Split SUSP Fluvirin 2013-2014 45 mcg (15 mcg x 3)/0.5 mL intramuscular suspension  ADM 0.5ML UTD    . levonorgestrel (MIRENA) 20 MCG/24HR IUD 1 each by Intrauterine route once.    . Multiple Vitamin (MULTIVITAMIN) tablet Take 1 tablet by mouth daily.    . methocarbamol (ROBAXIN) 500 MG tablet Take 1 tablet (500 mg total) by mouth every 8 (eight) hours as needed. 20 tablet 0  . predniSONE (DELTASONE) 20 MG tablet Take 1 tablet (20 mg total) by mouth daily with breakfast. 5 tablet 0   No current facility-administered medications for this visit.    Allergies: Penicillins and Zyrtec [cetirizine]  Past Medical History:  Diagnosis Date  . Allergy   . Headache   . History of blood transfusion   . History of stomach ulcers   . Hx of migraines   . [place] spotted fever     Past Surgical History:  Procedure Laterality Date  . CHOLECYSTECTOMY    . reconstructive surgery    . [place] fever      Family History  Problem Relation Age of Onset  . Alcohol abuse Father   . Cancer Father        colon/pancreatic  . Diabetes Father   . Heart disease Father   . Hyperlipidemia Father   . Hypertension Father   . Kidney disease Father   . Stroke Father   . Cancer Sister        ovarian    Social History   Tobacco Use  . Smoking status: Smoker, Current Status Unknown    Packs/day: 0.50    Types: Cigarettes  . Smokeless tobacco: Never Used  Substance Use Topics  . Alcohol use: Yes    Comment: occasionally/socially    Subjective:  Upper back pain x 3 weeks; has been working with her chiropractor and massage; feels like area of pain is localized under her left shoulder blade;  Limited benefit with ice, heat and TENS unit;   LMP-IUD   Objective:   Vitals:   05/17/20 1013  BP: 140/90  Pulse: 78  Temp: 98.7 F (37.1 C)  TempSrc: Oral  SpO2: 97%  Weight: 183 lb (83 kg)  Height: 5\' 5"  (1.651 m)    General: Well developed, well nourished, in no acute distress  Head: Normocephalic and atraumatic  Lungs: Respirations unlabored; clear to auscultation bilaterally without wheeze, rales, rhonchi  Musculoskeletal: No deformities; no active joint inflammation  Extremities: No edema, cyanosis, clubbing  Vessels: Symmetric bilaterally  Neurologic: Alert and oriented; speech intact; face symmetrical; moves all extremities well; CNII-XII intact without focal deficit   Assessment:  1. Muscle spasm     Plan:  Depo-Medrol 40 and Toradol IM 30 mg given in office today; Rx for Robaxin 500 mg qhs; Rx for Prednisone 20 mg qd x 5 days- start tomorrow; if no better or worse, will need to see sports medicine;  This visit occurred during the SARS-CoV-2 public health emergency.  Safety protocols were in place, including screening questions prior to the visit, additional usage of staff PPE, and extensive cleaning of exam room while observing appropriate contact time as indicated for disinfecting solutions.     No follow-ups on file.  No orders of the defined types were  placed in this encounter.   Requested Prescriptions   Signed Prescriptions Disp Refills  . predniSONE (DELTASONE) 20 MG tablet 5 tablet 0    Sig: Take 1 tablet (20 mg total) by mouth daily with breakfast.  . methocarbamol (ROBAXIN) 500 MG tablet 20 tablet 0    Sig: Take 1 tablet (500 mg total) by mouth every 8 (eight) hours as needed.

## 2020-05-17 NOTE — Addendum Note (Signed)
Addended by: Marijean Heath R on: 05/17/2020 11:06 AM   Modules accepted: Orders

## 2020-05-27 ENCOUNTER — Encounter: Payer: Self-pay | Admitting: Plastic Surgery

## 2020-05-27 ENCOUNTER — Ambulatory Visit (INDEPENDENT_AMBULATORY_CARE_PROVIDER_SITE_OTHER): Payer: 59 | Admitting: Plastic Surgery

## 2020-05-27 ENCOUNTER — Other Ambulatory Visit: Payer: Self-pay

## 2020-05-27 VITALS — BP 112/81 | HR 84 | Temp 98.8°F | Ht 65.0 in | Wt 180.0 lb

## 2020-05-27 DIAGNOSIS — M545 Low back pain, unspecified: Secondary | ICD-10-CM | POA: Diagnosis not present

## 2020-05-27 DIAGNOSIS — N62 Hypertrophy of breast: Secondary | ICD-10-CM

## 2020-05-27 DIAGNOSIS — M546 Pain in thoracic spine: Secondary | ICD-10-CM | POA: Diagnosis not present

## 2020-05-27 DIAGNOSIS — M4004 Postural kyphosis, thoracic region: Secondary | ICD-10-CM | POA: Diagnosis not present

## 2020-05-27 NOTE — Progress Notes (Signed)
Referring Provider Marrian Salvage, [name] [name],  [place] [address]   CC:  Chief Complaint  Patient presents with  . Consult      [name] [name] is an 44 y.o. female.  HPI: Patient presents to discuss breast reduction.  She has had years of back pain, neck pain and shoulder grooving related to her large breast.  She is currently a D cup and wants to be a C cup.  She has had mammograms in the past that have been normal and has never required a biopsy.  She tried over-the-counter medications, warm packs, cold packs and supportive garments for her pain with little relief.  She gets rashes beneath her breast that have been refractory to over-the-counter treatments.  She does smoke cigarettes but is not diabetic.  She has been to the chiropractor with little relief.  Allergies  Allergen Reactions  . Penicillins   . Zyrtec [Cetirizine]     Outpatient Encounter Medications as of 05/27/2020  Medication Sig  . Influenza Virus Vaccine Split SUSP Fluvirin 2013-2014 45 mcg (15 mcg x 3)/0.5 mL intramuscular suspension  ADM 0.5ML UTD  . levonorgestrel (MIRENA) 20 MCG/24HR IUD 1 each by Intrauterine route once.  . methocarbamol (ROBAXIN) 500 MG tablet Take 1 tablet (500 mg total) by mouth every 8 (eight) hours as needed.  . Multiple Vitamin (MULTIVITAMIN) tablet Take 1 tablet by mouth daily.  . predniSONE (DELTASONE) 20 MG tablet Take 1 tablet (20 mg total) by mouth daily with breakfast.   No facility-administered encounter medications on file as of 05/27/2020.     Past Medical History:  Diagnosis Date  . Allergy   . Headache   . History of blood transfusion   . History of stomach ulcers   . Hx of migraines   . [place] spotted fever     Past Surgical History:  Procedure Laterality Date  . CHOLECYSTECTOMY    . reconstructive surgery    . [place] fever      Family History  Problem Relation Age of Onset  . Alcohol abuse Father   . Cancer Father         colon/pancreatic  . Diabetes Father   . Heart disease Father   . Hyperlipidemia Father   . Hypertension Father   . Kidney disease Father   . Stroke Father   . Cancer Sister        ovarian    Social History   Social History Narrative  . Not on file     Review of Systems General: Denies fevers, chills, weight loss CV: Denies chest pain, shortness of breath, palpitations  Physical Exam Vitals with BMI 05/27/2020 05/17/2020 04/19/2020  Height 5\' 5"  5\' 5"  5\' 5"   Weight 180 lbs 183 lbs 174 lbs 13 oz  BMI 29.95 67.59 16.38  Systolic 466 599 357  Diastolic 81 90 76  Pulse 84 78 105    General:  No acute distress,  Alert and oriented, Non-Toxic, Normal speech and affect Breast: She has grade 2 ptosis.  Sternal notch to nipple is 32 cm on the right and 31 cm on the left.  Nipple to fold is 10 cm bilaterally.  I do not see any obvious scars or masses.  Assessment/Plan The patient has bilateral symptomatic macromastia.  She is a good candidate for a breast reduction.  She is interested in pursuing surgical treatment.  She has tried supportive garments and fitted bras with no relief.  The  details of breast reduction surgery were discussed.  I explained the procedure in detail along the with the expected scars.  The risks were discussed in detail and include bleeding, infection, damage to surrounding structures, need for additional procedures, nipple loss, change in nipple sensation, persistent pain, contour irregularities and asymmetries.  I explained that breast feeding is often not possible after breast reduction surgery.  We discussed the expected postoperative course with an overall recovery period of about 1 month.  She demonstrated full understanding of all risks.  We discussed her personal risk factors that include tobacco use.  I explained that she would need to stop smoking to be eligible for this procedure.  She feels that she can do that has been working on cutting back.  She is  interested in trying to have surgery around early February.  I explained we would test her for nicotine preoperatively and she is fine with that.  I anticipate approximately 550g of tissue removed from each side.   [name] 05/27/2020, 5:10 PM

## 2020-07-02 ENCOUNTER — Other Ambulatory Visit: Payer: 59

## 2020-07-05 ENCOUNTER — Telehealth: Payer: Self-pay | Admitting: Family

## 2020-07-05 NOTE — Telephone Encounter (Signed)
FYI patient drove to the office for blood work on 07/02/2020 and we were closed for the holiday and she was upset.

## 2020-07-05 NOTE — Telephone Encounter (Signed)
Called patient to reschedule and apologize for the mishaps. Patient has been advised to go to [facility] lab due to her busy schedule.

## 2020-07-08 ENCOUNTER — Telehealth: Payer: Self-pay | Admitting: Plastic Surgery

## 2020-07-08 NOTE — Telephone Encounter (Signed)
Called patient to discuss surgery approval. [name] [name] indicated that she wanted to know how much it was going to cost. I pulled her [employer] benefits and provided the deductible and out-of-pocket amounts to her. I advised that she is responsible for the deductible and 30% coninsurance up to her OOP. [name] said she cannot afford that right now and does not have anyone that can be with her after surgery. She will call back when she is able to secure both of those factors.

## 2020-10-29 LAB — HM MAMMOGRAPHY

## 2021-05-19 ENCOUNTER — Other Ambulatory Visit: Payer: Self-pay

## 2021-05-19 ENCOUNTER — Ambulatory Visit (INDEPENDENT_AMBULATORY_CARE_PROVIDER_SITE_OTHER): Payer: 59 | Admitting: Family

## 2021-05-19 ENCOUNTER — Telehealth: Payer: Self-pay | Admitting: Family

## 2021-05-19 VITALS — BP 110/73 | HR 73 | Temp 98.3°F | Resp 18 | Ht 65.0 in | Wt 191.8 lb

## 2021-05-19 DIAGNOSIS — Z1211 Encounter for screening for malignant neoplasm of colon: Secondary | ICD-10-CM

## 2021-05-19 DIAGNOSIS — Z13220 Encounter for screening for lipoid disorders: Secondary | ICD-10-CM | POA: Diagnosis not present

## 2021-05-19 DIAGNOSIS — Z1159 Encounter for screening for other viral diseases: Secondary | ICD-10-CM

## 2021-05-19 DIAGNOSIS — Z0000 Encounter for general adult medical examination without abnormal findings: Secondary | ICD-10-CM

## 2021-05-19 LAB — LIPID PANEL
Cholesterol: 178 mg/dL (ref 0–200)
HDL: 37.7 mg/dL — ABNORMAL LOW (ref >39.00)
LDL Cholesterol: 127 mg/dL — ABNORMAL HIGH (ref 0–99)
NonHDL: 140.22
Total CHOL/HDL Ratio: 5
Triglycerides: 65 mg/dL (ref 0.0–149.0)
VLDL: 13 mg/dL (ref 0.0–40.0)

## 2021-05-19 LAB — CBC WITH DIFFERENTIAL/PLATELET
Basophils Absolute: 0.1 10*3/uL (ref 0.0–0.1)
Basophils Relative: 0.9 % (ref 0.0–3.0)
Eosinophils Absolute: 0.3 10*3/uL (ref 0.0–0.7)
Eosinophils Relative: 2.9 % (ref 0.0–5.0)
HCT: 43 % (ref 36.0–46.0)
Hemoglobin: 13.8 g/dL (ref 12.0–15.0)
Lymphocytes Relative: 30.6 % (ref 12.0–46.0)
Lymphs Abs: 2.7 10*3/uL (ref 0.7–4.0)
MCHC: 32.1 g/dL (ref 30.0–36.0)
MCV: 84 fl (ref 78.0–100.0)
Monocytes Absolute: 0.4 10*3/uL (ref 0.1–1.0)
Monocytes Relative: 4.3 % (ref 3.0–12.0)
Neutro Abs: 5.4 10*3/uL (ref 1.4–7.7)
Neutrophils Relative %: 61.3 % (ref 43.0–77.0)
Platelets: 232 10*3/uL (ref 150.0–400.0)
RBC: 5.12 Mil/uL — ABNORMAL HIGH (ref 3.87–5.11)
RDW: 14.1 % (ref 11.5–15.5)
WBC: 8.8 10*3/uL (ref 4.0–10.5)

## 2021-05-19 LAB — COMPREHENSIVE METABOLIC PANEL
ALT: 11 U/L (ref 0–35)
AST: 15 U/L (ref 0–37)
Albumin: 4.2 g/dL (ref 3.5–5.2)
Alkaline Phosphatase: 56 U/L (ref 39–117)
BUN: 7 mg/dL (ref 6–23)
CO2: 30 mEq/L (ref 19–32)
Calcium: 9.4 mg/dL (ref 8.4–10.5)
Chloride: 107 mEq/L (ref 96–112)
Creatinine, Ser: 0.79 mg/dL (ref 0.40–1.20)
GFR: 90.09 mL/min (ref >60.00)
Glucose, Bld: 85 mg/dL (ref 70–99)
Potassium: 4.2 mEq/L (ref 3.5–5.1)
Sodium: 142 mEq/L (ref 135–145)
Total Bilirubin: 0.6 mg/dL (ref 0.2–1.2)
Total Protein: 6.7 g/dL (ref 6.0–8.3)

## 2021-05-19 LAB — TSH: TSH: 1.62 u[IU]/mL (ref 0.35–5.50)

## 2021-05-19 NOTE — Progress Notes (Signed)
[name] [name] is a 45 y.o. female with the following history as recorded in EpicCare:  There are no problems to display for this patient.   Current Outpatient Medications  Medication Sig Dispense Refill   levonorgestrel (MIRENA) 20 MCG/24HR IUD 1 each by Intrauterine route once.     No current facility-administered medications for this visit.    Allergies: Penicillins and Zyrtec [cetirizine]  Past Medical History:  Diagnosis Date   Allergy    Headache    History of blood transfusion    History of stomach ulcers    Hx of migraines    Rock[place] Mountain spotted fever     Past Surgical History:  Procedure Laterality Date   CHOLECYSTECTOMY     reconstructive surgery     [place] fever      Family History  Problem Relation Age of Onset   Alcohol abuse Father    Cancer Father        colon/pancreatic   Diabetes Father    Heart disease Father    Hyperlipidemia Father    Hypertension Father    Kidney disease Father    Stroke Father    Cancer Sister        ovarian    Social History   Tobacco Use   Smoking status: Smoker, Current Status Unknown    Packs/day: 0.50    Types: Cigarettes   Smokeless tobacco: Never  Substance Use Topics   Alcohol use: Yes    Comment: occasionally/socially    Subjective:   Presents for yearly CPE; does have GYN; + smoker- not ready to quit;  LMP-IUD History of sleep apnea diagnosed in 2020- patient deferred autoPAP/ was planning to discuss with her dentist; notes she has not spoken to her dentist but is planning to see them in January 2023;       Objective:  Vitals:   05/19/21 1255  BP: 110/73  Pulse: 73  Resp: 18  Temp: 98.3 F (36.8 C)  TempSrc: Oral  SpO2: 98%  Weight: 191 lb 12.8 oz (87 kg)  Height: _0  (1.651 m)    General: Well developed, well nourished, in no acute distress  Skin : Warm and dry.  Head: Normocephalic and atraumatic  Eyes: Sclera and conjunctiva clear; pupils round and reactive to light;  extraocular movements intact  Ears: External normal; canals clear; tympanic membranes normal  Oropharynx: Pink, supple. No suspicious lesions  Neck: Supple without thyromegaly, adenopathy  Lungs: Respirations unlabored; clear to auscultation bilaterally without wheeze, rales, rhonchi  CVS exam: normal rate and regular rhythm.  Abdomen: Soft; nontender; nondistended; normoactive bowel sounds; no masses or hepatosplenomegaly  Musculoskeletal: No deformities; no active joint inflammation  Extremities: No edema, cyanosis, clubbing  Vessels: Symmetric bilaterally  Neurologic: Alert and oriented; speech intact; face symmetrical; moves all extremities well; CNII-XII intact without focal deficit   Assessment:  1. PE (physical exam), annual   2. Lipid screening   3. Need for hepatitis C screening test   4. Screening for colon cancer     Plan:  Age appropriate preventive healthcare needs addressed; encouraged regular eye doctor and dental exams; encouraged regular exercise; will update labs and refills as needed today; follow-up to be determined; Needs to quit smoking; she will discuss sleep apnea results with her dentist- information provided; Order for colonoscopy updated;  Follow up in 1 year, sooner prn.  This visit occurred during the SARS-CoV-2 public health emergency.  Safety protocols were in place, including screening questions prior to  the visit, additional usage of staff PPE, and extensive cleaning of exam room while observing appropriate contact time as indicated for disinfecting solutions.    No follow-ups on file.  Orders Placed This Encounter  Procedures   CBC with Differential/Platelet   Comp Met (CMET)   Lipid panel   Hepatitis C Antibody   TSH   Ambulatory referral to Gastroenterology    Referral Priority:   Routine    Referral Type:   Consultation    Referral Reason:   Specialty Services Required    Number of Visits Requested:   1    Requested Prescriptions    No  prescriptions requested or ordered in this encounter

## 2021-05-19 NOTE — Telephone Encounter (Signed)
Pt called and stated she needs a not for work stating "Due to a complicated neurologic health history, this patient avoids all vaccines" as provided on 09.31.21. Please advise.

## 2021-05-20 LAB — HEPATITIS C ANTIBODY
Hepatitis C Ab: NONREACTIVE
SIGNAL TO CUT-OFF: <0.02 (ref <1.00)

## 2021-05-20 NOTE — Telephone Encounter (Signed)
Pt called and letter sent to pt via my-chart.

## 2021-05-20 NOTE — Telephone Encounter (Signed)
Please advise if letter is okay to write? I have pended the letter and ready to sign off once the okay is given.

## 2021-05-20 NOTE — Telephone Encounter (Signed)
I have called pt and informed her that the letter has been sent via my-chart.

## 2021-05-24 ENCOUNTER — Encounter: Payer: Self-pay | Admitting: Family

## 2021-05-31 ENCOUNTER — Other Ambulatory Visit: Payer: Self-pay | Admitting: *Deleted

## 2021-06-28 ENCOUNTER — Ambulatory Visit (AMBULATORY_SURGERY_CENTER): Payer: 59 | Admitting: *Deleted

## 2021-06-28 ENCOUNTER — Encounter: Payer: Self-pay | Admitting: Gastroenterology

## 2021-06-28 VITALS — Ht 65.0 in | Wt 190.0 lb

## 2021-06-28 DIAGNOSIS — Z1211 Encounter for screening for malignant neoplasm of colon: Secondary | ICD-10-CM

## 2021-06-28 MED ORDER — PLENVU 140 G PO SOLR: 1.0000 | Freq: Once | ORAL | 0 refills | Status: AC

## 2021-06-28 NOTE — Progress Notes (Signed)
No egg or soy allergy known to patient  No issues known to pt with past sedation with any surgeries or procedures Patient denies ever being told they had issues or difficulty with intubation  No FH of Malignant Hyperthermia Pt is not on diet pills Pt is not on  home 02  Pt is not on blood thinners  Pt denies issues with constipation  No A fib or A flutter   Plenvu Coupon given to pt in PV today , Code to Pharmacy and  NO PA's for preps discussed with pt In PV today  Discussed with pt there will be an out-of-pocket cost for prep and that varies from $0 to 70 +  dollars - pt verbalized understanding   Due to the COVID-19 pandemic we are asking patients to follow certain guidelines in PV and the [facility]   Pt aware of COVID protocols and [facility] guidelines

## 2021-07-24 ENCOUNTER — Encounter: Payer: Self-pay | Admitting: Certified Registered Nurse Anesthetist

## 2021-07-25 ENCOUNTER — Ambulatory Visit (AMBULATORY_SURGERY_CENTER): Payer: 59 | Admitting: Gastroenterology

## 2021-07-25 ENCOUNTER — Encounter: Payer: Self-pay | Admitting: Gastroenterology

## 2021-07-25 VITALS — BP 115/72 | HR 82 | Temp 98.0°F | Resp 12 | Ht 65.0 in | Wt 190.0 lb

## 2021-07-25 DIAGNOSIS — D122 Benign neoplasm of ascending colon: Secondary | ICD-10-CM | POA: Diagnosis not present

## 2021-07-25 DIAGNOSIS — D123 Benign neoplasm of transverse colon: Secondary | ICD-10-CM | POA: Diagnosis not present

## 2021-07-25 DIAGNOSIS — Z1211 Encounter for screening for malignant neoplasm of colon: Secondary | ICD-10-CM

## 2021-07-25 MED ORDER — SODIUM CHLORIDE 0.9 % IV SOLN: 500.0000 mL | Freq: Once | INTRAVENOUS | Status: DC

## 2021-07-25 NOTE — Op Note (Signed)
[place] Patient Name: [name] [name] Procedure Date: 07/25/2021 1:22 PM MRN: [id number] Endoscopist: Jackquline Denmark , MD Age: 45 Referring MD:  Date of Birth: 04-06-1976 Gender: Female Account #: [id number] Procedure:                Colonoscopy Indications:              Screening for colorectal malignant neoplasm Medicines:                Monitored Anesthesia Care Procedure:                Pre-Anesthesia Assessment:                           - Prior to the procedure, a History and Physical                            was performed, and patient medications and                            allergies were reviewed. The patient's tolerance of                            previous anesthesia was also reviewed. The risks                            and benefits of the procedure and the sedation                            options and risks were discussed with the patient.                            All questions were answered, and informed consent                            was obtained. Prior Anticoagulants: The patient has                            taken no previous anticoagulant or antiplatelet                            agents. ASA Grade Assessment: II - A patient with                            mild systemic disease. After reviewing the risks                            and benefits, the patient was deemed in                            satisfactory condition to undergo the procedure.                           After obtaining informed consent, the colonoscope  was passed under direct vision. Throughout the                            procedure, the patient's blood pressure, pulse, and                            oxygen saturations were monitored continuously. The                            Olympus PCF-H190DL (#[id number]) Colonoscope was                            introduced through the anus and advanced to the 2                            cm into the ileum.  The colonoscopy was performed                            without difficulty. The patient tolerated the                            procedure well. The quality of the bowel                            preparation was good. The terminal ileum, ileocecal                            valve, appendiceal orifice, and rectum were                            photographed. Scope In: 1:38:21 PM Scope Out: 1:58:12 PM Scope Withdrawal Time: 0 hours 16 minutes 43 seconds  Total Procedure Duration: 0 hours 19 minutes 51 seconds  Findings:                 A 6 mm polyp was found in the distal ascending                            colon. The polyp was sessile. The polyp was removed                            with a cold snare. Resection and retrieval were                            complete.                           A 25 mm polyp was found in the distal transverse                            colon, 50 cm from the anal verge. The polyp was                            pedunculated. The polyp was removed with  a hot                            snare. Resection and retrieval were complete.                            Retrieved by Gracy Racer. Area was tattooed with an                            injection of 2 mL of [place] ink.                           Non-bleeding internal hemorrhoids were found during                            retroflexion. The hemorrhoids were small and Grade                            I (internal hemorrhoids that do not prolapse).                           The terminal ileum appeared normal.                           The exam was otherwise without abnormality on                            direct and retroflexion views. Complications:            No immediate complications. Estimated Blood Loss:     Estimated blood loss: none. Impression:               - One 6 mm polyp in the distal ascending colon,                            removed with a cold snare. Resected and retrieved.                            - One 25 mm polyp in the distal transverse colon,                            removed with a hot snare. Resected and retrieved.                            Tattooed.                           - Non-bleeding internal hemorrhoids.                           - The examined portion of the ileum was normal.                           - The examination was otherwise normal on direct  and retroflexion views. Recommendation:           - Patient has a contact number available for                            emergencies. The signs and symptoms of potential                            delayed complications were discussed with the                            patient. Return to normal activities tomorrow.                            Written discharge instructions were provided to the                            patient.                           - Resume previous diet.                           - Continue present medications.                           - No aspirin, ibuprofen, naproxen, or other                            non-steroidal anti-inflammatory drugs for 5 days                            after polyp removal.                           - Await pathology results.                           - Repeat colonoscopy for surveillance based on                            pathology results.                           - The findings and recommendations were discussed                            with the patient's family. Jackquline Denmark, MD 07/25/2021 2:02:49 PM This report has been signed electronically.

## 2021-07-25 NOTE — Patient Instructions (Signed)
Handouts provided:  Polyps  NO aspirin, ibuprofen, naproxen, or other non-steroidal anti-inflammatory drugs  YOU HAD AN ENDOSCOPIC PROCEDURE TODAY AT [facility]:   Refer to the procedure report that was given to you for any specific questions about what was found during the examination.  If the procedure report does not answer your questions, please call your gastroenterologist to clarify.  If you requested that your care partner not be given the details of your procedure findings, then the procedure report has been included in a sealed envelope for you to review at your convenience later.  YOU SHOULD EXPECT: Some feelings of bloating in the abdomen. Passage of more gas than usual.  Walking can help get rid of the air that was put into your GI tract during the procedure and reduce the bloating. If you had a lower endoscopy (such as a colonoscopy or flexible sigmoidoscopy) you may notice spotting of blood in your stool or on the toilet paper. If you underwent a bowel prep for your procedure, you may not have a normal bowel movement for a few days.  Please Note:  You might notice some irritation and congestion in your nose or some drainage.  This is from the oxygen used during your procedure.  There is no need for concern and it should clear up in a day or so.  SYMPTOMS TO REPORT IMMEDIATELY:  Following lower endoscopy (colonoscopy or flexible sigmoidoscopy):  Excessive amounts of blood in the stool  Significant tenderness or worsening of abdominal pains  Swelling of the abdomen that is new, acute  Fever of 100F or higher  For urgent or emergent issues, a gastroenterologist can be reached at any hour by calling [phone]. Do not use MyChart messaging for urgent concerns.    DIET:  We do recommend a small meal at first, but then you may proceed to your regular diet.  Drink plenty of fluids but you should avoid alcoholic beverages for 24 hours.  ACTIVITY:  You should plan  to take it easy for the rest of today and you should NOT DRIVE or use heavy machinery until tomorrow (because of the sedation medicines used during the test).    FOLLOW UP: Our staff will call the number listed on your records 48-72 hours following your procedure to check on you and address any questions or concerns that you may have regarding the information given to you following your procedure. If we do not reach you, we will leave a message.  We will attempt to reach you two times.  During this call, we will ask if you have developed any symptoms of COVID 19. If you develop any symptoms (ie: fever, flu-like symptoms, shortness of breath, cough etc.) before then, please call [phone].  If you test positive for Covid 19 in the 2 weeks post procedure, please call and report this information to [place].    If any biopsies were taken you will be contacted by phone or by letter within the next 1-3 weeks.  Please call us at [phone] if you have not heard about the biopsies in 3 weeks.    SIGNATURES/CONFIDENTIALITY: You and/or your care partner have signed paperwork which will be entered into your electronic medical record.  These signatures attest to the fact that that the information above on your After Visit Summary has been reviewed and is understood.  Full responsibility of the confidentiality of this discharge information lies with you and/or your care-partner.
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
## 2021-07-25 NOTE — Progress Notes (Signed)
1340 Robinul 0.1 mg IV given due large amount of secretions upon assessment.  MD made aware, vss 

## 2021-07-25 NOTE — Progress Notes (Signed)
Pt's states no medical or surgical changes since previsit or office visit. 

## 2021-07-25 NOTE — Progress Notes (Signed)
Called to room to assist during endoscopic procedure.  Patient ID and intended procedure confirmed with present staff. Received instructions for my participation in the procedure from the performing physician.  

## 2021-07-25 NOTE — Progress Notes (Signed)
Report given to PACU, vss 

## 2021-07-26 ENCOUNTER — Telehealth: Payer: Self-pay | Admitting: Gastroenterology

## 2021-07-26 NOTE — Telephone Encounter (Signed)
noted 

## 2021-07-26 NOTE — Telephone Encounter (Signed)
Spoke with pt- she states "since yesterday, I have had a severe pain underneath the left side of my rib cage."  She rates pain now as an "8."  She has been able to eat and is passing air without difficulty.    Dr. Lyndel Safe,  Please advise.  Thanks, [employer]

## 2021-07-26 NOTE — Telephone Encounter (Signed)
Inbound call from patient. Had procedure 12/19. When she got home she ate a little and since then on to today she have been experiencing abd pain. She have pain when she breathe, cough, sneeze, even have a hiccup. She would like to know if this is normal after procedure?

## 2021-07-26 NOTE — Telephone Encounter (Signed)
I have talked to her over the phone in detail.  The LUQ abdominal pain is much better now.  It "felt like a bruise". No fever or chills.  She has been able to take p.o. without any problems.  Plan: -I have asked her to take Tylenol as needed.   -Call us if there is worsening of abdominal pain -Since it is getting much better, hold off on any further testing currently. -Pathology is not back yet.  RG

## 2021-07-27 ENCOUNTER — Telehealth: Payer: Self-pay

## 2021-07-27 NOTE — Telephone Encounter (Signed)
°  Follow up Call-  Call back number 07/25/2021  Post procedure Call Back phone  # [phone]  Permission to leave phone message Yes  Some recent data might be hidden     Patient questions:  Do you have a fever, pain , or abdominal swelling? No. Pain Score  0 *  Have you tolerated food without any problems? Yes.    Have you been able to return to your normal activities? Yes.    Do you have any questions about your discharge instructions: Diet   No. Medications  No. Follow up visit  No.  Do you have questions or concerns about your Care? No.  Actions: * If pain score is 4 or above: No action needed, pain <4.

## 2021-07-28 ENCOUNTER — Encounter: Payer: Self-pay | Admitting: Gastroenterology

## 2022-03-16 LAB — RESULTS CONSOLE HPV: CHL HPV: NEGATIVE

## 2022-03-25 LAB — HM PAP SMEAR: HM Pap smear: NEGATIVE

## 2022-05-23 ENCOUNTER — Encounter: Payer: 59 | Admitting: Family

## 2022-06-06 ENCOUNTER — Ambulatory Visit (INDEPENDENT_AMBULATORY_CARE_PROVIDER_SITE_OTHER): Payer: 59 | Admitting: Family

## 2022-06-06 ENCOUNTER — Encounter: Payer: Self-pay | Admitting: Family

## 2022-06-06 VITALS — BP 130/80 | HR 70 | Temp 98.1°F | Ht 65.0 in | Wt 190.2 lb

## 2022-06-06 DIAGNOSIS — Z0000 Encounter for general adult medical examination without abnormal findings: Secondary | ICD-10-CM | POA: Diagnosis not present

## 2022-06-06 DIAGNOSIS — Z13220 Encounter for screening for lipoid disorders: Secondary | ICD-10-CM | POA: Diagnosis not present
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
## 2022-06-07 LAB — LIPID PANEL
Cholesterol: 180 mg/dL (ref 0–200)
HDL: 44.3 mg/dL (ref >39.00)
LDL Cholesterol: 123 mg/dL — ABNORMAL HIGH (ref 0–99)
NonHDL: 135.55
Total CHOL/HDL Ratio: 4
Triglycerides: 65 mg/dL (ref 0.0–149.0)
VLDL: 13 mg/dL (ref 0.0–40.0)

## 2022-06-07 LAB — COMPREHENSIVE METABOLIC PANEL
ALT: 11 U/L (ref 0–35)
AST: 14 U/L (ref 0–37)
Albumin: 4.4 g/dL (ref 3.5–5.2)
Alkaline Phosphatase: 55 U/L (ref 39–117)
BUN: 7 mg/dL (ref 6–23)
CO2: 28 mEq/L (ref 19–32)
Calcium: 9.6 mg/dL (ref 8.4–10.5)
Chloride: 105 mEq/L (ref 96–112)
Creatinine, Ser: 0.76 mg/dL (ref 0.40–1.20)
GFR: 93.68 mL/min (ref >60.00)
Glucose, Bld: 78 mg/dL (ref 70–99)
Potassium: 4.2 mEq/L (ref 3.5–5.1)
Sodium: 140 mEq/L (ref 135–145)
Total Bilirubin: 0.7 mg/dL (ref 0.2–1.2)
Total Protein: 7 g/dL (ref 6.0–8.3)

## 2022-06-07 LAB — CBC WITH DIFFERENTIAL/PLATELET
Basophils Absolute: 0.1 10*3/uL (ref 0.0–0.1)
Basophils Relative: 1.2 % (ref 0.0–3.0)
Eosinophils Absolute: 0.2 10*3/uL (ref 0.0–0.7)
Eosinophils Relative: 1.8 % (ref 0.0–5.0)
HCT: 43.1 % (ref 36.0–46.0)
Hemoglobin: 14.1 g/dL (ref 12.0–15.0)
Lymphocytes Relative: 27.9 % (ref 12.0–46.0)
Lymphs Abs: 2.9 10*3/uL (ref 0.7–4.0)
MCHC: 32.7 g/dL (ref 30.0–36.0)
MCV: 83.3 fl (ref 78.0–100.0)
Monocytes Absolute: 0.4 10*3/uL (ref 0.1–1.0)
Monocytes Relative: 4.1 % (ref 3.0–12.0)
Neutro Abs: 6.7 10*3/uL (ref 1.4–7.7)
Neutrophils Relative %: 65 % (ref 43.0–77.0)
Platelets: 239 10*3/uL (ref 150.0–400.0)
RBC: 5.17 Mil/uL — ABNORMAL HIGH (ref 3.87–5.11)
RDW: 14.6 % (ref 11.5–15.5)
WBC: 10.4 10*3/uL (ref 4.0–10.5)

## 2023-06-08 ENCOUNTER — Encounter: Payer: 59 | Admitting: Family

## 2023-08-08 DIAGNOSIS — Z860100 Personal history of colon polyps, unspecified: Secondary | ICD-10-CM

## 2023-08-08 HISTORY — DX: Personal history of colon polyps, unspecified: Z86.0100

## 2023-12-21 ENCOUNTER — Encounter: Payer: 59 | Admitting: Family

## 2024-04-24 LAB — HM MAMMOGRAPHY

## 2024-04-25 ENCOUNTER — Encounter: Payer: 59 | Admitting: Family

## 2024-05-13 DIAGNOSIS — Z0000 Encounter for general adult medical examination without abnormal findings: Secondary | ICD-10-CM | POA: Insufficient documentation
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
## 2024-05-15 ENCOUNTER — Encounter: Payer: Self-pay | Admitting: Student

## 2024-05-15 ENCOUNTER — Ambulatory Visit (INDEPENDENT_AMBULATORY_CARE_PROVIDER_SITE_OTHER): Admitting: Student

## 2024-05-15 VITALS — BP 124/81 | HR 65 | Temp 98.3°F | Resp 12 | Ht 65.0 in | Wt 173.4 lb

## 2024-05-15 DIAGNOSIS — Z13220 Encounter for screening for lipoid disorders: Secondary | ICD-10-CM

## 2024-05-15 DIAGNOSIS — Z87898 Personal history of other specified conditions: Secondary | ICD-10-CM | POA: Diagnosis not present

## 2024-05-15 DIAGNOSIS — Z860100 Personal history of colon polyps, unspecified: Secondary | ICD-10-CM | POA: Diagnosis not present

## 2024-05-15 DIAGNOSIS — Z1211 Encounter for screening for malignant neoplasm of colon: Secondary | ICD-10-CM | POA: Diagnosis not present

## 2024-05-15 DIAGNOSIS — E785 Hyperlipidemia, unspecified: Secondary | ICD-10-CM | POA: Diagnosis not present

## 2024-05-15 DIAGNOSIS — Z720 Tobacco use: Secondary | ICD-10-CM | POA: Insufficient documentation

## 2024-05-15 DIAGNOSIS — Z0000 Encounter for general adult medical examination without abnormal findings: Secondary | ICD-10-CM | POA: Diagnosis not present

## 2024-05-15 NOTE — Assessment & Plan Note (Signed)
 Update Lipid panel. Hx of mixed hyperlipidemia, consider statin medication for ASCVD prevention if continues to be elevated. Encourage heart healthy diet such as MIND or DASH diet, increase exercise, avoid trans fats, simple carbohydrates and processed foods, consider a krill or fish or flaxseed oil cap daily.

## 2024-05-15 NOTE — Assessment & Plan Note (Signed)
 Pt reports seizure disorder. Denies current seizures, denies recent seizure activity. Refer to neurology for further evaluation, advise discussion of potential for seizure preventative medication.

## 2024-05-15 NOTE — Assessment & Plan Note (Addendum)
 HCM: -Mgm and Pap: Follows with Dr. Horacio with [facility]- updated this year, records requested - Colonoscopy: Last 2022- Hx colon polys, Due 2025. Referral to Gi sent - Immunizations: Pt declines

## 2024-05-15 NOTE — Assessment & Plan Note (Signed)
Patient encouraged to maintain heart healthy diet, regular exercise, adequate sleep. Consider daily probiotics 

## 2024-05-15 NOTE — Assessment & Plan Note (Signed)
 Currenlty smoking 0.5 PPD. Counseled patient on the health risks. Encouraged use of support resources like Quitline (1-800-QUIT-NOW), apps, or counseling for additional accountability and motivation. She declines cessation assistance currently.

## 2024-05-16 ENCOUNTER — Ambulatory Visit: Payer: Self-pay | Admitting: Student

## 2024-05-16 LAB — LIPID PANEL
Cholesterol: 181 mg/dL (ref 0–200)
HDL: 43 mg/dL (ref >39.00)
LDL Cholesterol: 128 mg/dL — ABNORMAL HIGH (ref 0–99)
NonHDL: 137.68
Total CHOL/HDL Ratio: 4
Triglycerides: 50 mg/dL (ref 0.0–149.0)
VLDL: 10 mg/dL (ref 0.0–40.0)

## 2024-05-16 LAB — CBC WITH DIFFERENTIAL/PLATELET
Basophils Absolute: 0.1 K/uL (ref 0.0–0.1)
Basophils Relative: 0.9 % (ref 0.0–3.0)
Eosinophils Absolute: 0.3 K/uL (ref 0.0–0.7)
Eosinophils Relative: 2.2 % (ref 0.0–5.0)
HCT: 45.8 % (ref 36.0–46.0)
Hemoglobin: 14.5 g/dL (ref 12.0–15.0)
Lymphocytes Relative: 24.8 % (ref 12.0–46.0)
Lymphs Abs: 3 K/uL (ref 0.7–4.0)
MCHC: 31.7 g/dL (ref 30.0–36.0)
MCV: 84.4 fl (ref 78.0–100.0)
Monocytes Absolute: 0.5 K/uL (ref 0.1–1.0)
Monocytes Relative: 4.6 % (ref 3.0–12.0)
Neutro Abs: 8 K/uL — ABNORMAL HIGH (ref 1.4–7.7)
Neutrophils Relative %: 67.5 % (ref 43.0–77.0)
Platelets: 252 K/uL (ref 150.0–400.0)
RBC: 5.43 Mil/uL — ABNORMAL HIGH (ref 3.87–5.11)
RDW: 14 % (ref 11.5–15.5)
WBC: 11.9 K/uL — ABNORMAL HIGH (ref 4.0–10.5)

## 2024-05-16 LAB — COMPREHENSIVE METABOLIC PANEL WITH GFR
ALT: 7 U/L (ref 0–35)
AST: 11 U/L (ref 0–37)
Albumin: 4.5 g/dL (ref 3.5–5.2)
Alkaline Phosphatase: 56 U/L (ref 39–117)
BUN: 8 mg/dL (ref 6–23)
CO2: 26 meq/L (ref 19–32)
Calcium: 9.4 mg/dL (ref 8.4–10.5)
Chloride: 106 meq/L (ref 96–112)
Creatinine, Ser: 0.79 mg/dL (ref 0.40–1.20)
GFR: 88.21 mL/min (ref >60.00)
Glucose, Bld: 86 mg/dL (ref 70–99)
Potassium: 4.8 meq/L (ref 3.5–5.1)
Sodium: 139 meq/L (ref 135–145)
Total Bilirubin: 0.7 mg/dL (ref 0.2–1.2)
Total Protein: 7.1 g/dL (ref 6.0–8.3)

## 2024-05-16 LAB — TSH: TSH: 2.25 u[IU]/mL (ref 0.35–5.50)

## 2024-05-19 ENCOUNTER — Encounter: Payer: Self-pay | Admitting: *Deleted

## 2024-06-03 ENCOUNTER — Encounter: Payer: Self-pay | Admitting: Gastroenterology

## 2024-07-07 ENCOUNTER — Ambulatory Visit

## 2024-07-07 VITALS — Ht 65.0 in | Wt 174.2 lb

## 2024-07-07 DIAGNOSIS — Z860100 Personal history of colon polyps, unspecified: Secondary | ICD-10-CM

## 2024-07-07 NOTE — Progress Notes (Signed)
 PCP MD at time of PV: Harlene Jolly, NP  __________________________________________________________________________________________________________________________________________  No egg allergy known to patient  No soy allergy known to patient No issues known to pt with past sedation with any surgeries or procedures Patient denies ever being told they had issues or difficulty with intubation  No FH of Malignant Hyperthermia Pt is not on diet pills Pt is not on  home 02  Pt is not on blood thinners  No A fib or A flutter Have any cardiac testing pending--no  LOA: independent  No Chew or Snuff tobacco __________________________________________________________________________________________________________________________________________  Constipation: no  Prep: Spilt dose Miralax  __________________________________________________________________________________________________________________________________________  PV completed with patient. Prep instructions reviewed and provided during apt. Rx sent to preferred pharmacy.  __________________________________________________________________________________________________________________________________________  Patient's chart reviewed by Norleen Schillings CNRA prior to previsit and patient appropriate for the [facility].  Previsit completed and red dot placed by patient's name on their procedure day (on provider's schedule).

## 2024-07-14 ENCOUNTER — Encounter: Payer: Self-pay | Admitting: Gastroenterology

## 2024-07-15 ENCOUNTER — Ambulatory Visit: Payer: Self-pay

## 2024-07-15 NOTE — Telephone Encounter (Signed)
 Appt scheduled

## 2024-07-15 NOTE — Progress Notes (Unsigned)
   Acute Office Visit  Subjective:     Patient ID: [name] [name], female    DOB: 1975-08-22, 48 y.o.   MRN: [id number]  No chief complaint on file.   HPI Patient is in today for ***  ROS      Objective:    There were no vitals taken for this visit. {Vitals History (Optional):23777}  Physical Exam  No results found for any visits on 07/17/24.      Assessment & Plan:   Problem List Items Addressed This Visit   None   No orders of the defined types were placed in this encounter.   No follow-ups on file.  Virdie Penning L Miguelangel Korn, NP

## 2024-07-15 NOTE — Telephone Encounter (Signed)
 FYI Only or Action Required?: FYI only for provider: appointment scheduled on 05/17/2024 at 10:20 AM.  Patient was last seen in primary care on 05/15/2024 by Wheeler Harlene CROME, NP.  Called Nurse Triage reporting Anxiety.  Symptoms began several days ago.  Interventions attempted: Rest, hydration, or home remedies.  Symptoms are: unchanged.  Triage Disposition: See PCP When Office is Open (Within 3 Days)  Patient/caregiver understands and will follow disposition?: Yes  Copied from CRM [id number]. Topic: Clinical - Red Word Triage >> Jul 15, 2024  8:26 AM [name] [name] wrote: Reason for RMF:ejupzwu called stating she had a weird feeling inside of her yesterday. She felt anxious, hot, dizzy. She felt like throwing up and felt like she was having a panic attack. Patient job has been stressing her out Reason for Disposition  MODERATE anxiety (e.g., persistent or frequent anxiety symptoms; interferes with sleep, school, or work)  Answer Assessment - Initial Assessment Questions Patient concerned for possible panic attack yesterday. Reports she started having increased stress with her job on Friday. Reports she felt like she had a panic attack yesterday. Patient endorses wanting to be seen at PCP office for evaluation.   1. CONCERN: Did anything happen that prompted you to call today?      Patient concerned with stress involving her job. Patient reports she is getting dinged for not meeting metrics with her job. Reports that she have felt increase stress and anxiety since Friday. Feels like she may be having panic attacks 2. ANXIETY SYMPTOMS: Can you describe how you (your loved one; patient) have been feeling? (e.g., tense, restless, panicky, anxious, keyed up, overwhelmed, sense of impending doom).      Anxious, panicky 3. ONSET: How long have you been feeling this way? (e.g., hours, days, weeks)     Friday 4. SEVERITY: How would you rate the level of anxiety? (e.g., 0 - 10; or mild,  moderate, severe).     Mild to moderate 5. FUNCTIONAL IMPAIRMENT: How have these feelings affected your ability to do daily activities? Have you had more difficulty than usual doing your normal daily activities? (e.g., getting better, same, worse; self-care, school, work, interactions)     Affecting her job 6. HISTORY: Have you felt this way before? Have you ever been diagnosed with an anxiety problem in the past? (e.g., generalized anxiety disorder, panic attacks, PTSD). If Yes, ask: How was this problem treated? (e.g., medicines, counseling, etc.)     no 7. RISK OF HARM - SUICIDAL IDEATION: Do you ever have thoughts of hurting or killing yourself? If Yes, ask:  Do you have these feelings now? Do you have a plan on how you would do this?     no 8. TREATMENT:  What has been done so far to treat this anxiety? (e.g., medicines, relaxation strategies). What has helped?     therapy 9. THERAPIST: Do you have a counselor or therapist? If Yes, ask: What is their name?     yes 10. POTENTIAL TRIGGERS: Do you drink caffeinated beverages (e.g., coffee, colas, teas), and how much daily? Do you drink alcohol or use any drugs? Have you started any new medicines recently?       Coffee in the morning. No alcohol or drug use.  11. PATIENT SUPPORT: Who is with you now? Who do you live with? Do you have family or friends who you can talk to?        Lives by herself. Speaks to family and talks with her therapist.  12. OTHER SYMPTOMS: Do you have any other symptoms? (e.g., feeling depressed, trouble concentrating, trouble sleeping, trouble breathing, palpitations or fast heartbeat, chest pain, sweating, nausea, or diarrhea)       nausea  Protocols used: Anxiety and Panic Attack-A-AH

## 2024-07-17 ENCOUNTER — Ambulatory Visit: Admitting: Student

## 2024-07-17 ENCOUNTER — Encounter: Payer: Self-pay | Admitting: Student

## 2024-07-17 VITALS — BP 136/79 | HR 82 | Ht 65.0 in | Wt 174.2 lb

## 2024-07-17 DIAGNOSIS — Z87898 Personal history of other specified conditions: Secondary | ICD-10-CM

## 2024-07-17 DIAGNOSIS — F419 Anxiety disorder, unspecified: Secondary | ICD-10-CM | POA: Insufficient documentation

## 2024-07-17 LAB — VITAMIN B12: Vitamin B-12: 155 pg/mL — ABNORMAL LOW (ref 211–911)

## 2024-07-17 LAB — VITAMIN D 25 HYDROXY (VIT D DEFICIENCY, FRACTURES): VITD: <7 ng/mL — ABNORMAL LOW (ref 30.00–100.00)

## 2024-07-17 MED ORDER — HYDROXYZINE HCL 10 MG PO TABS: 10.0000 mg | ORAL_TABLET | Freq: Three times a day (TID) | ORAL | 3 refills | Status: AC | PRN

## 2024-07-17 NOTE — Assessment & Plan Note (Addendum)
-   Denies current seizures, denies recent seizure activity. - Encouraged neurology follow-up, referral sent last OV

## 2024-07-17 NOTE — Assessment & Plan Note (Signed)
 Experiencing anxiety with panic attacks, possibly exacerbated by work stress. She has been seen by [facility] in the past. Concerns for possible mood disorder. Perimenopausal symptoms may be c/t symptoms. No psychiatric evaluation or medication management currently. Counseling ongoing.  - Referred to psychiatry for evaluation and management. - Prescribed hydroxyzine 10 mg for panic attacks, up to three times daily as needed for panic - Checked vitamin B12 and D levels. - Continue counseling through [facility].

## 2024-07-18 ENCOUNTER — Ambulatory Visit: Payer: Self-pay | Admitting: Student

## 2024-07-18 DIAGNOSIS — E538 Deficiency of other specified B group vitamins: Secondary | ICD-10-CM

## 2024-07-18 DIAGNOSIS — E559 Vitamin D deficiency, unspecified: Secondary | ICD-10-CM

## 2024-07-18 MED ORDER — VITAMIN D (ERGOCALCIFEROL) 1.25 MG (50000 UNIT) PO CAPS: 50000.0000 [IU] | ORAL_CAPSULE | ORAL | 3 refills | Status: AC

## 2024-07-18 MED ORDER — VITAMIN B-12 1000 MCG PO TABS: 1000.0000 ug | ORAL_TABLET | Freq: Every day | ORAL | Status: AC

## 2024-07-21 ENCOUNTER — Ambulatory Visit: Admitting: Gastroenterology

## 2024-07-21 ENCOUNTER — Encounter: Payer: Self-pay | Admitting: Gastroenterology

## 2024-07-21 VITALS — BP 123/82 | HR 64 | Temp 97.3°F | Resp 14 | Ht 65.0 in | Wt 174.0 lb

## 2024-07-21 DIAGNOSIS — Z860100 Personal history of colon polyps, unspecified: Secondary | ICD-10-CM

## 2024-07-21 DIAGNOSIS — D124 Benign neoplasm of descending colon: Secondary | ICD-10-CM

## 2024-07-21 DIAGNOSIS — D123 Benign neoplasm of transverse colon: Secondary | ICD-10-CM

## 2024-07-21 DIAGNOSIS — D122 Benign neoplasm of ascending colon: Secondary | ICD-10-CM

## 2024-07-21 DIAGNOSIS — D125 Benign neoplasm of sigmoid colon: Secondary | ICD-10-CM

## 2024-07-21 DIAGNOSIS — K635 Polyp of colon: Secondary | ICD-10-CM | POA: Diagnosis not present

## 2024-07-21 MED ORDER — SODIUM CHLORIDE 0.9 % IV SOLN: 500.0000 mL | Freq: Once | INTRAVENOUS | Status: DC

## 2024-07-21 NOTE — Op Note (Signed)
 [facility] Patient Name: [name] [name] Procedure Date: 07/21/2024 10:39 AM MRN: [id number] Endoscopist: Lynnie Bring , MD, [id number] Age: 48 Referring MD:  Date of Birth: 06/11/1976 Gender: Female Account #: [id number] Procedure:                Colonoscopy Indications:              High risk colon cancer surveillance: Personal                            history of advanced colonic polyps Dec 2022 Medicines:                Monitored Anesthesia Care Procedure:                Pre-Anesthesia Assessment:                           - Prior to the procedure, a History and Physical                            was performed, and patient medications and                            allergies were reviewed. The patient's tolerance of                            previous anesthesia was also reviewed. The risks                            and benefits of the procedure and the sedation                            options and risks were discussed with the patient.                            All questions were answered, and informed consent                            was obtained. Prior Anticoagulants: The patient has                            taken no anticoagulant or antiplatelet agents. ASA                            Grade Assessment: II - A patient with mild systemic                            disease. After reviewing the risks and benefits,                            the patient was deemed in satisfactory condition to                            undergo the procedure.  After obtaining informed consent, the colonoscope                            was passed under direct vision. Throughout the                            procedure, the patient's blood pressure, pulse, and                            oxygen saturations were monitored continuously. The                            PCF-HQ190L Colonoscope [id number] was introduced                            through the anus and  advanced to the 2 cm into the                            ileum. The colonoscopy was performed without                            difficulty. The patient tolerated the procedure                            well. The quality of the bowel preparation was                            good. The ileocecal valve, appendiceal orifice, and                            rectum were photographed. Scope In: 10:43:52 AM Scope Out: 11:08:58 AM Scope Withdrawal Time: 0 hours 20 minutes 25 seconds  Total Procedure Duration: 0 hours 25 minutes 6 seconds  Findings:                 Two semi-sessile polyps were found in the proximal                            and distal ascending colon. The polyps were 4 to 10                            mm in size. The larger polyp (distal ascending) was                            on a thick pedicle, removed with a hot snare and                            the smaller 1 removed with cold snare. Resection                            and retrieval were complete.                           Two sessile  polyps were found in the mid transverse                            colon. The polyps were 4 to 5 mm in size. These                            polyps were removed with a cold snare. Resection                            and retrieval were complete.                           A tattoo was seen in the distal transverse colon.                            The tattoo site appeared normal. No recurrence of                            polyps.                           Four sessile polyps were found in the distal                            sigmoid colon and mid descending colon. The polyps                            were 4 to 6 mm in size. These polyps were removed                            with a cold snare. Resection and retrieval were                            complete.                           The colon (entire examined portion) was mildly                            redundant.                            Non-bleeding internal hemorrhoids were found during                            retroflexion. The hemorrhoids were small and Grade                            I (internal hemorrhoids that do not prolapse).                           The exam was otherwise without abnormality on  direct and retroflexion views. Complications:            No immediate complications. Estimated Blood Loss:     Estimated blood loss: none. Impression:               - Two 4 to 10 mm polyps in the ascending colon.                            Resected and retrieved.                           - Two 4 to 5 mm polyps in the mid transverse colon,                            removed with a cold snare. Resected and retrieved.                           - A tattoo was seen in the distal transverse colon.                            The tattoo site appeared normal.                           - Four 4 to 6 mm polyps in the distal sigmoid colon                            and in the mid descending colon, removed with a                            cold snare. Resected and retrieved.                           - Redundant colon.                           - Non-bleeding internal hemorrhoids.                           - The examination was otherwise normal on direct                            and retroflexion views. Recommendation:           - Patient has a contact number available for                            emergencies. The signs and symptoms of potential                            delayed complications were discussed with the                            patient. Return to normal activities tomorrow.  Written discharge instructions were provided to the                            patient.                           - Resume previous diet.                           - Continue present medications.                           - No aspirin, ibuprofen, naproxen, or other                             non-steroidal anti-inflammatory drugs for 5 days                            after polyp removal.                           - Await pathology results.                           - Repeat colonoscopy for surveillance based on                            pathology results.                           - The findings and recommendations were discussed                            with the patient's family. Lynnie Bring, MD 07/21/2024 11:16:54 AM This report has been signed electronically.

## 2024-07-21 NOTE — Progress Notes (Signed)
 Called to room to assist during endoscopic procedure.  Patient ID and intended procedure confirmed with present staff. Received instructions for my participation in the procedure from the performing physician.

## 2024-07-21 NOTE — Progress Notes (Signed)
 Vss nad trans to pacu

## 2024-07-21 NOTE — Progress Notes (Signed)
 Pt's states no medical or surgical changes since previsit or office visit.

## 2024-07-21 NOTE — Patient Instructions (Addendum)
-  Handout on polyps provided. -await pathology results. -repeat colonoscopy for surveillance recommended. Date to be determined when pathology result become available.  -Continue present medications. No aspirin, ibuprofen, naproxen, or other non-steroidal anti-inflammatory drugs for 5 days after polyp removal.   YOU HAD AN ENDOSCOPIC PROCEDURE TODAY AT THE [facility]:   Refer to the procedure report that was given to you for any specific questions about what was found during the examination.  If the procedure report does not answer your questions, please call your gastroenterologist to clarify.  If you requested that your care partner not be given the details of your procedure findings, then the procedure report has been included in a sealed envelope for you to review at your convenience later.  YOU SHOULD EXPECT: Some feelings of bloating in the abdomen. Passage of more gas than usual.  Walking can help get rid of the air that was put into your GI tract during the procedure and reduce the bloating. If you had a lower endoscopy (such as a colonoscopy or flexible sigmoidoscopy) you may notice spotting of blood in your stool or on the toilet paper. If you underwent a bowel prep for your procedure, you may not have a normal bowel movement for a few days.  Please Note:  You might notice some irritation and congestion in your nose or some drainage.  This is from the oxygen used during your procedure.  There is no need for concern and it should clear up in a day or so.  SYMPTOMS TO REPORT IMMEDIATELY:  Following lower endoscopy (colonoscopy or flexible sigmoidoscopy):  Excessive amounts of blood in the stool  Significant tenderness or worsening of abdominal pains  Swelling of the abdomen that is new, acute  Fever of 100F or higher  For urgent or emergent issues, a gastroenterologist can be reached at any hour by calling [phone]. Do not use MyChart messaging for urgent concerns.     DIET:  We do recommend a small meal at first, but then you may proceed to your regular diet.  Drink plenty of fluids but you should avoid alcoholic beverages for 24 hours.  ACTIVITY:  You should plan to take it easy for the rest of today and you should NOT DRIVE or use heavy machinery until tomorrow (because of the sedation medicines used during the test).    FOLLOW UP: Our staff will call the number listed on your records the next business day following your procedure.  We will call around 7:15- 8:00 am to check on you and address any questions or concerns that you may have regarding the information given to you following your procedure. If we do not reach you, we will leave a message.     If any biopsies were taken you will be contacted by phone or by letter within the next 1-3 weeks.  Please call us  at [phone] if you have not heard about the biopsies in 3 weeks.    SIGNATURES/CONFIDENTIALITY: You and/or your care partner have signed paperwork which will be entered into your electronic medical record.  These signatures attest to the fact that that the information above on your After Visit Summary has been reviewed and is understood.  Full responsibility of the confidentiality of this discharge information lies with you and/or your care-partner.
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
## 2024-07-22 ENCOUNTER — Encounter: Payer: Self-pay | Admitting: Neurology

## 2024-07-22 ENCOUNTER — Telehealth: Payer: Self-pay

## 2024-07-22 ENCOUNTER — Ambulatory Visit: Admitting: Neurology

## 2024-07-22 VITALS — BP 121/97 | HR 83 | Ht 65.0 in | Wt 174.4 lb

## 2024-07-22 DIAGNOSIS — R569 Unspecified convulsions: Secondary | ICD-10-CM

## 2024-07-22 DIAGNOSIS — R7989 Other specified abnormal findings of blood chemistry: Secondary | ICD-10-CM

## 2024-07-22 NOTE — Patient Instructions (Addendum)
 We will do a brain scan, called MRI and call you with the test results. We will have to schedule you for this on a separate date. This test requires authorization from your insurance, and we will take care of the insurance process.  We will do an EEG (brainwave test), which we will schedule. We will call you with the results.  Your exam is normal today, which is reassuring.   So long as your test results are benign, we will keep you posted by phone call and/or MyChart message and follow-up in this clinic as needed.  Please try to get prior neurology records from your previous providers so we can update your records and scan paper records into your chart as well as an electronic copy.  Please increase your water intake to about 64 ounces of water per day.

## 2024-07-22 NOTE — Telephone Encounter (Signed)
°  Follow up Call-     07/21/2024   10:08 AM  Call back number  Post procedure Call Back phone  # [phone]  Permission to leave phone message Yes     Left message
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
## 2024-07-23 ENCOUNTER — Ambulatory Visit: Admitting: *Deleted

## 2024-07-23 ENCOUNTER — Ambulatory Visit: Payer: Self-pay | Admitting: Neurology

## 2024-07-23 DIAGNOSIS — R569 Unspecified convulsions: Secondary | ICD-10-CM | POA: Diagnosis not present

## 2024-07-23 NOTE — Procedures (Signed)
° ° °  History:  48 year old woman with seizure like activity   EEG classification: Awake and drowsy  Duration: 25 minutes   Technical aspects: This EEG study was done with scalp electrodes positioned according to the 10-20 International system of electrode placement. Electrical activity was reviewed with band pass filter of 1-70Hz , sensitivity of 7 uV/mm, display speed of 40mm/sec with a 60Hz  notched filter applied as appropriate. EEG data were recorded continuously and digitally stored.   Description of the recording: The background rhythms of this recording consists of a fairly well modulated medium amplitude alpha rhythm of 11 Hz that is reactive to eye opening and closure. Present in the anterior head region is a 15-20 Hz beta activity. Photic stimulation was performed, did not show any abnormalities. Hyperventilation was also performed, did not show any abnormalities. Drowsiness was manifested by background fragmentation. No abnormal epileptiform discharges seen during this recording. There was no focal slowing. There were no electrographic seizure identified.   Abnormality: None   Impression: This is a normal awake and drowsy EEG. No evidence of interictal epileptiform discharges. Normal EEGs, however, do not rule out epilepsy.    Sequoya Hogsett, MD [facility]

## 2024-07-24 LAB — SURGICAL PATHOLOGY

## 2024-08-26 ENCOUNTER — Ambulatory Visit: Payer: Self-pay | Admitting: Gastroenterology

## 2024-08-28 ENCOUNTER — Encounter: Payer: Self-pay | Admitting: Student

## 2024-09-17 ENCOUNTER — Ambulatory Visit (HOSPITAL_COMMUNITY): Payer: Self-pay | Admitting: Psychiatry

## 2024-10-29 ENCOUNTER — Ambulatory Visit (HOSPITAL_COMMUNITY): Payer: Self-pay | Admitting: Psychiatry
